# Patient Record
Sex: Female | Born: 1955
Health system: Southern US, Community
[De-identification: ages and names within clinical notes are randomized; demographics above are authoritative.]

## PROBLEM LIST (undated history)

## (undated) DIAGNOSIS — I10 Essential (primary) hypertension: Secondary | ICD-10-CM

## (undated) DIAGNOSIS — I4891 Unspecified atrial fibrillation: Secondary | ICD-10-CM

## (undated) DIAGNOSIS — N200 Calculus of kidney: Secondary | ICD-10-CM

## (undated) DIAGNOSIS — M797 Fibromyalgia: Secondary | ICD-10-CM

## (undated) DIAGNOSIS — F329 Major depressive disorder, single episode, unspecified: Secondary | ICD-10-CM

## (undated) DIAGNOSIS — F32A Depression, unspecified: Secondary | ICD-10-CM

## (undated) DIAGNOSIS — E785 Hyperlipidemia, unspecified: Secondary | ICD-10-CM

## (undated) DIAGNOSIS — K589 Irritable bowel syndrome without diarrhea: Secondary | ICD-10-CM

## (undated) HISTORY — DX: Hyperlipidemia, unspecified: E78.5

## (undated) HISTORY — DX: Unspecified atrial fibrillation: I48.91

## (undated) HISTORY — PX: APPENDECTOMY: SHX54

## (undated) HISTORY — DX: Irritable bowel syndrome, unspecified: K58.9

## (undated) HISTORY — DX: Depression, unspecified: F32.A

## (undated) HISTORY — DX: Major depressive disorder, single episode, unspecified: F32.9

## (undated) HISTORY — PX: OOPHORECTOMY: SHX86

## (undated) HISTORY — PX: ABDOMINAL HYSTERECTOMY: SHX81

## (undated) HISTORY — DX: Calculus of kidney: N20.0

## (undated) HISTORY — DX: Essential (primary) hypertension: I10

## (undated) HISTORY — DX: Fibromyalgia: M79.7

## (undated) HISTORY — PX: TUBAL LIGATION: SHX77

## (undated) HISTORY — PX: CHOLECYSTECTOMY: SHX55

## (undated) HISTORY — PX: TONSILLECTOMY: SUR1361

---

## 2010-10-25 ENCOUNTER — Encounter: Payer: Self-pay | Admitting: Cardiology

## 2010-10-26 ENCOUNTER — Encounter (INDEPENDENT_AMBULATORY_CARE_PROVIDER_SITE_OTHER): Payer: Medicare Other

## 2010-10-26 ENCOUNTER — Ambulatory Visit (INDEPENDENT_AMBULATORY_CARE_PROVIDER_SITE_OTHER): Payer: Medicare Other | Admitting: Cardiology

## 2010-10-26 ENCOUNTER — Encounter: Payer: Self-pay | Admitting: Cardiology

## 2010-10-26 VITALS — BP 120/82 | HR 92 | Ht 67.0 in | Wt 218.0 lb

## 2010-10-26 DIAGNOSIS — R002 Palpitations: Secondary | ICD-10-CM

## 2010-10-26 DIAGNOSIS — E785 Hyperlipidemia, unspecified: Secondary | ICD-10-CM | POA: Insufficient documentation

## 2010-10-26 NOTE — Progress Notes (Signed)
HPI: 55 year old female with no prior cardiac history or evaluation palpitations. Note patient had thyroid functions in September of 2012 were normal. Potassium normal. Patient states she has had intermittent palpitations for approximately 2 years. They are sudden in onset and described as her heart racing. There is short of breath and mild dizziness. There is mild neck tightness. No chest pain. Her symptoms last approximately 4 hours and resolved. She otherwise has mild dyspnea on exertion and pedal edema but no exertional chest pain. No history of syncope. Because of the above we were asked to further evaluate.  Current Outpatient Prescriptions  Medication Sig Dispense Refill  . ALPRAZolam (XANAX) 1 MG tablet Take 1 mg by mouth at bedtime as needed.        [name] aspirin 81 MG tablet Take 81 mg by mouth daily.        . B Complex-Biotin-FA (B COMPLETE PO) Take 1 tablet by mouth daily.        [name] diltiazem (CARDIZEM CD) 180 MG 24 hr capsule Take 180 mg by mouth daily.        [name] eletriptan (RELPAX) 40 MG tablet One tablet by mouth as needed for migraine headache.  If the headache improves and then returns, dose may be repeated after 2 hours have elapsed since first dose (do not exceed 80 mg per day). may repeat in 2 hours if necessary       . GARLIC OIL PO Take 1 tablet by mouth daily.        [name] HYDROcodone-acetaminophen (LORTAB 7.5) 7.5-500 MG per tablet Take 1 tablet by mouth every 6 (six) hours as needed.        [name] KRILL OIL PO Take 1 tablet by mouth daily.        . metoprolol tartrate (LOPRESSOR) 25 MG tablet Take 25 mg by mouth 2 (two) times daily.        [name] NABUMETONE PO Take 750 mg by mouth daily.        . NON FORMULARY SLIMQUICK 2 DAILY       . nortriptyline (PAMELOR) 25 MG capsule 2 tabs po qhs       . solifenacin (VESICARE) 5 MG tablet Take 10 mg by mouth daily.        [name] tiZANidine (ZANAFLEX) 4 MG capsule Take 4 mg by mouth at bedtime.          No Known Allergies  Past Medical History    Diagnosis Date  . Depression   . Migraine   . Hyperlipidemia   . Asthma   . IBS (irritable bowel syndrome)   . Fibromyalgia     Past Surgical History  Procedure Date  . Cholecystectomy   . Oophorectomy   . Tonsillectomy   . Appendectomy   . Cesarean section   . Abdominal hysterectomy     History   Social History  . Marital Status: Married    Spouse Name: N/A    Number of Children: 2  . Years of Education: N/A   Occupational History  .      Disabled   Social History Main Topics  . Smoking status: Never Smoker   . Smokeless tobacco: Not on file  . Alcohol Use: Yes     rarely  . Drug Use: Not on file  . Sexually Active: Not on file   Other Topics Concern  . Not on file   Social History Narrative  . No narrative on file    Family  History  Problem Relation Age of Onset  . Cancer Father   . Heart disease Mother     atrial fibrillation and CHF  . Heart disease Father     atrial fibrillation and CHF    ROS: pain from fibromyalgia but no fevers or chills, productive cough, hemoptysis, dysphasia, odynophagia, melena, hematochezia, dysuria, hematuria, rash, seizure activity, orthopnea, PND,  claudication. Remaining systems are negative.  Physical Exam: General:  Well developed/obese in NAD Skin warm/dry Patient not depressed No peripheral clubbing Back-normal HEENT-normal/normal eyelids Neck supple/normal carotid upstroke bilaterally; no bruits; no JVD; no thyromegaly chest - CTA/ normal expansion CV - RRR/normal S1 and S2; no rubs or gallops;  PMI nondisplaced; 1/6 SEM Abdomen -NT/ND, no HSM, no mass, + bowel sounds, no bruit 2+ femoral pulses, no bruits Ext-trace edema, no chords, 2+ DP Neuro-grossly nonfocal  ECG 09/24/10 NSR, RVCD

## 2010-10-26 NOTE — Assessment & Plan Note (Signed)
Management per primary care. 

## 2010-10-26 NOTE — Patient Instructions (Signed)
Your physician has recommended that you wear an event monitor. Event monitors are medical devices that record the heart's electrical activity. Doctors most often [place] these monitors to diagnose arrhythmias. Arrhythmias are problems with the speed or rhythm of the heartbeat. The monitor is a small, portable device. You can wear one while you do your normal daily activities. This is usually used to diagnose what is causing palpitations/syncope (passing out).   Your physician recommends that you schedule a follow-up appointment in: 6 weeks with Dr [name]  Your physician has requested that you have an echocardiogram. Echocardiography is a painless test that uses sound waves to create images of your heart. It provides your doctor with information about the size and shape of your heart and how well your heart's chambers and valves are working. This procedure takes approximately one hour. There are no restrictions for this procedure.

## 2010-10-26 NOTE — Assessment & Plan Note (Signed)
Plan cardionet. Check echocardiogram for LV function. Recent thyroid functions normal. Continue Lopressor as needed.

## 2010-11-02 ENCOUNTER — Ambulatory Visit (HOSPITAL_COMMUNITY): Payer: Medicare Other | Attending: Cardiology

## 2010-11-02 DIAGNOSIS — I517 Cardiomegaly: Secondary | ICD-10-CM

## 2010-11-02 DIAGNOSIS — E785 Hyperlipidemia, unspecified: Secondary | ICD-10-CM | POA: Insufficient documentation

## 2010-11-02 DIAGNOSIS — I059 Rheumatic mitral valve disease, unspecified: Secondary | ICD-10-CM | POA: Insufficient documentation

## 2010-11-02 DIAGNOSIS — R002 Palpitations: Secondary | ICD-10-CM | POA: Insufficient documentation

## 2010-11-07 ENCOUNTER — Telehealth (HOSPITAL_COMMUNITY): Payer: Self-pay | Admitting: Cardiology

## 2010-11-07 NOTE — Telephone Encounter (Signed)
PT AWARE OF ECHO RESULTS./CY 

## 2010-11-07 NOTE — Telephone Encounter (Signed)
Pt called and stated someone had called her cell, does not know who or why she got a call. She is wearing a monitor.

## 2010-12-05 ENCOUNTER — Telehealth: Payer: Self-pay | Admitting: Cardiology

## 2010-12-05 ENCOUNTER — Ambulatory Visit (INDEPENDENT_AMBULATORY_CARE_PROVIDER_SITE_OTHER): Payer: Medicare Other | Admitting: Cardiology

## 2010-12-05 ENCOUNTER — Encounter: Payer: Self-pay | Admitting: Cardiology

## 2010-12-05 DIAGNOSIS — E785 Hyperlipidemia, unspecified: Secondary | ICD-10-CM

## 2010-12-05 DIAGNOSIS — I4891 Unspecified atrial fibrillation: Secondary | ICD-10-CM

## 2010-12-05 MED ORDER — DILTIAZEM HCL ER COATED BEADS 240 MG PO CP24: 240.0000 mg | ORAL_CAPSULE | Freq: Every day | ORAL | Status: DC

## 2010-12-05 NOTE — Telephone Encounter (Signed)
Pt needs diagnosis from her visit today, pls call tomorrow

## 2010-12-05 NOTE — Assessment & Plan Note (Signed)
The patient appears to have both runs of atrial fibrillation and flutter on her monitor. I will review this with electrophysiology. If it were all atrial flutter we could consider ablation but I do think there is coarse atrial fibrillation as well. Her only embolic risk factor is the female sex. We discussed xeralto or pradaxa but she declined and understands risk of CVA. Continue aspirin. Increase Cardizem to 240 mg daily. If her symptoms persist despite increase in AV node blocking agents we will consider an antiarrhythmic such as flecainide.

## 2010-12-05 NOTE — Assessment & Plan Note (Signed)
Management per primary care. 

## 2010-12-05 NOTE — Progress Notes (Signed)
HPI:55 year old female with no prior cardiac history I initially saw in Oct of 2012 for evaluation of palpitations. Note patient had thyroid functions in September of 2012 were normal. Potassium normal. Cardionet revealed runs of afib/flutter. Echo in Oct of 2012 revealed normal LV function and mild LAE. Since I last saw her she denies dyspnea on exertion, chest pain or syncope. She continued to have intermittent palpitations.   Current Outpatient Prescriptions  Medication Sig Dispense Refill  . ALPRAZolam (XANAX) 1 MG tablet Take 1 mg by mouth at bedtime as needed.        [name] aspirin 81 MG tablet Take 81 mg by mouth daily.        . B Complex-Biotin-FA (B COMPLETE PO) Take 2 tablets by mouth daily.       [name] diltiazem (CARDIZEM CD) 180 MG 24 hr capsule Take 180 mg by mouth daily.        [name] eletriptan (RELPAX) 40 MG tablet One tablet by mouth as needed for migraine headache.  If the headache improves and then returns, dose may be repeated after 2 hours have elapsed since first dose (do not exceed 80 mg per day). may repeat in 2 hours if necessary       . GARLIC OIL PO Take 2 tablets by mouth daily.       [name] HYDROcodone-acetaminophen (LORTAB 7.5) 7.5-500 MG per tablet Take 1 tablet by mouth every 6 (six) hours as needed.        [name] KRILL OIL PO Take 4 tablets by mouth daily.       . metoprolol tartrate (LOPRESSOR) 25 MG tablet Take 25 mg by mouth as needed.       [name] NABUMETONE PO Take 750 mg by mouth daily.        . NON FORMULARY SLIMQUICK 2 DAILY      . nortriptyline (PAMELOR) 25 MG capsule 2 tabs po qhs       . solifenacin (VESICARE) 5 MG tablet Take 10 mg by mouth daily.        [name] tiZANidine (ZANAFLEX) 4 MG capsule Take 4 mg by mouth at bedtime.           Past Medical History  Diagnosis Date  . Depression   . Migraine   . Hyperlipidemia   . Asthma   . IBS (irritable bowel syndrome)   . Fibromyalgia     Past Surgical History  Procedure Date  . Cholecystectomy   . Oophorectomy   .  Tonsillectomy   . Appendectomy   . Cesarean section   . Abdominal hysterectomy     History   Social History  . Marital Status: Married    Spouse Name: N/A    Number of Children: 2  . Years of Education: N/A   Occupational History  .      Disabled   Social History Main Topics  . Smoking status: Never Smoker   . Smokeless tobacco: Not on file  . Alcohol Use: Yes     rarely  . Drug Use: Not on file  . Sexually Active: Not on file   Other Topics Concern  . Not on file   Social History Narrative  . No narrative on file    ROS: no fevers or chills, productive cough, hemoptysis, dysphasia, odynophagia, melena, hematochezia, dysuria, hematuria, rash, seizure activity, orthopnea, PND, pedal edema, claudication. Remaining systems are negative.  Physical Exam: Well-developed well-nourished in no acute distress.  Skin is warm and dry.  HEENT is normal.  Neck is supple. No thyromegaly.  Chest is clear to auscultation with normal expansion.  Cardiovascular exam is regular rate and rhythm.  Abdominal exam nontender or distended. No masses palpated. Extremities show no edema. neuro grossly intact

## 2010-12-05 NOTE — Patient Instructions (Signed)
Your physician recommends that you schedule a follow-up appointment in: 3 MONTHS  INCREASE DILTIAZEM TO 240 MG ONCE DAILY

## 2010-12-06 NOTE — Telephone Encounter (Signed)
PT AWARE  MONITOR SHOWED AFIB AND FLUTTER .Zack Seal

## 2011-08-03 ENCOUNTER — Telehealth: Payer: Self-pay | Admitting: Cardiology

## 2011-08-03 NOTE — Telephone Encounter (Signed)
New msg Pt wants to talk to you about  passing out. She is in [place] and wants to talk to a nurse before making an appt. Please call

## 2011-08-03 NOTE — Telephone Encounter (Signed)
Appt made to see Connie Jackson.  Pt c/o multiple syncopal episodes.

## 2011-08-18 ENCOUNTER — Encounter: Payer: Self-pay | Admitting: Nurse Practitioner

## 2011-08-18 ENCOUNTER — Encounter (HOSPITAL_COMMUNITY): Payer: Self-pay | Admitting: Emergency Medicine

## 2011-08-18 ENCOUNTER — Emergency Department (HOSPITAL_COMMUNITY): Payer: Medicare Other

## 2011-08-18 ENCOUNTER — Observation Stay (HOSPITAL_COMMUNITY)
Admission: EM | Admit: 2011-08-18 | Discharge: 2011-08-20 | Disposition: A | Discharge status: Home / Self Care | Payer: Medicare Other | Attending: Internal Medicine | Admitting: Internal Medicine

## 2011-08-18 ENCOUNTER — Ambulatory Visit (INDEPENDENT_AMBULATORY_CARE_PROVIDER_SITE_OTHER): Payer: Medicare Other | Admitting: Nurse Practitioner

## 2011-08-18 VITALS — BP 112/62 | HR 124 | Ht 67.0 in | Wt 217.0 lb

## 2011-08-18 DIAGNOSIS — R55 Syncope and collapse: Principal | ICD-10-CM | POA: Diagnosis present

## 2011-08-18 DIAGNOSIS — I4891 Unspecified atrial fibrillation: Secondary | ICD-10-CM | POA: Diagnosis present

## 2011-08-18 DIAGNOSIS — E785 Hyperlipidemia, unspecified: Secondary | ICD-10-CM | POA: Diagnosis present

## 2011-08-18 DIAGNOSIS — F319 Bipolar disorder, unspecified: Secondary | ICD-10-CM | POA: Insufficient documentation

## 2011-08-18 DIAGNOSIS — F410 Panic disorder [episodic paroxysmal anxiety] without agoraphobia: Secondary | ICD-10-CM | POA: Insufficient documentation

## 2011-08-18 DIAGNOSIS — F603 Borderline personality disorder: Secondary | ICD-10-CM | POA: Insufficient documentation

## 2011-08-18 DIAGNOSIS — G43909 Migraine, unspecified, not intractable, without status migrainosus: Secondary | ICD-10-CM | POA: Diagnosis present

## 2011-08-18 DIAGNOSIS — R002 Palpitations: Secondary | ICD-10-CM | POA: Diagnosis present

## 2011-08-18 DIAGNOSIS — J45909 Unspecified asthma, uncomplicated: Secondary | ICD-10-CM | POA: Insufficient documentation

## 2011-08-18 LAB — URINALYSIS, ROUTINE W REFLEX MICROSCOPIC
Bilirubin Urine: NEGATIVE
Glucose, UA: NEGATIVE mg/dL
Leukocytes, UA: NEGATIVE
Nitrite: NEGATIVE
Specific Gravity, Urine: 1.008 (ref 1.005–1.030)
pH: 7 (ref 5.0–8.0)

## 2011-08-18 LAB — CBC WITH DIFFERENTIAL/PLATELET
Basophils Relative: 0 % (ref 0–1)
Eosinophils Absolute: 0.1 10*3/uL (ref 0.0–0.7)
Eosinophils Relative: 1 % (ref 0–5)
Lymphs Abs: 1.7 10*3/uL (ref 0.7–4.0)
MCH: 31.4 pg (ref 26.0–34.0)
MCHC: 33.8 g/dL (ref 30.0–36.0)
MCV: 92.9 fL (ref 78.0–100.0)
Neutrophils Relative %: 76 % (ref 43–77)
Platelets: 341 10*3/uL (ref 150–400)
RDW: 13.5 % (ref 11.5–15.5)

## 2011-08-18 LAB — POCT I-STAT TROPONIN I: Troponin i, poc: 0.01 ng/mL (ref 0.00–0.08)

## 2011-08-18 LAB — COMPREHENSIVE METABOLIC PANEL
ALT: 15 U/L (ref 0–35)
Albumin: 4 g/dL (ref 3.5–5.2)
Calcium: 9.6 mg/dL (ref 8.4–10.5)
GFR calc Af Amer: 87 mL/min — ABNORMAL LOW (ref >90)
Glucose, Bld: 107 mg/dL — ABNORMAL HIGH (ref 70–99)
Potassium: 4.2 mEq/L (ref 3.5–5.1)
Sodium: 142 mEq/L (ref 135–145)
Total Protein: 7.6 g/dL (ref 6.0–8.3)

## 2011-08-18 NOTE — ED Provider Notes (Cosign Needed)
History     CSN: [id number]  Arrival date & time 08/18/11  1552   First MD Initiated Contact with Patient 08/18/11 1754      Chief Complaint  Patient presents with  . Atrial Fibrillation    (Consider location/radiation/quality/duration/timing/severity/associated sxs/prior treatment) Patient is a 56 y.o. female presenting with syncope. The history is provided by the patient and the spouse.  Loss of Consciousness This is a new problem. The current episode started 1 to 4 weeks ago. The problem occurs every several days. The problem has been gradually worsening (increasing frequency). Associated symptoms include headaches. Pertinent negatives include no abdominal pain, anorexia, chest pain, chills, congestion, diaphoresis, nausea, neck pain, numbness, urinary symptoms, vertigo, visual change or weakness. Exacerbated by: unknown. She has tried nothing for the symptoms.  Pt has had syncopal episodes of increasing frequency for the past 1.5 weeks.  Pt has had a history of afib and feels her heart racing just prior to episodes.  Husband reports she will have a blank stare and is unresponsive for seconds to minutes prior to losing consciousness.  After the episode she has been unable to speak sometimes for as long as an hour.  Past Medical History  Diagnosis Date  . Depression   . Migraine   . Hyperlipidemia   . Asthma   . IBS (irritable bowel syndrome)   . Fibromyalgia     Past Surgical History  Procedure Date  . Cholecystectomy   . Oophorectomy   . Tonsillectomy   . Appendectomy   . Cesarean section   . Abdominal hysterectomy     Family History  Problem Relation Age of Onset  . Cancer Father   . Heart disease Mother     atrial fibrillation and CHF  . Heart disease Father     atrial fibrillation and CHF    History  Substance Use Topics  . Smoking status: Never Smoker   . Smokeless tobacco: Not on file  . Alcohol Use: Yes     rarely    OB History    Grav Para Term  Preterm Abortions TAB SAB Ect Mult Living                  Review of Systems  Constitutional: Negative for chills and diaphoresis.  HENT: Negative for congestion and neck pain.   Eyes: Negative.   Respiratory: Negative for chest tightness and shortness of breath.   Cardiovascular: Positive for palpitations and syncope. Negative for chest pain.  Gastrointestinal: Negative.  Negative for nausea, abdominal pain and anorexia.  Genitourinary: Negative.   Musculoskeletal: Negative.   Neurological: Positive for dizziness, light-headedness and headaches. Negative for vertigo, weakness and numbness.  All other systems reviewed and are negative.    Allergies  Dht; Penicillins; Promethazine hcl; and Sulfa antibiotics  Home Medications   Current Outpatient Rx  Name Route Sig Dispense Refill  . ALPRAZOLAM 1 MG PO TABS Oral Take 1 mg by mouth 2 (two) times daily as needed. For anxiety    . B COMPLETE PO Oral Take 2 tablets by mouth daily.     . CHLORTHALIDONE 25 MG PO TABS Oral Take 25 mg by mouth daily as needed. For fluid    . CO Q-10 PO Oral Take 2 capsules by mouth daily.    [name] DILTIAZEM HCL ER COATED BEADS 240 MG PO CP24 Oral Take 1 capsule (240 mg total) by mouth daily. 30 capsule 12  . ELETRIPTAN HYDROBROMIDE 40 MG PO TABS Oral One  tablet by mouth as needed for migraine headache.  If the headache improves and then returns, dose may be repeated after 2 hours have elapsed since first dose (do not exceed 80 mg per day). may repeat in 2 hours if necessary     . OMEGA-3 FATTY ACIDS 1000 MG PO CAPS Oral Take 2 g by mouth daily.    [name] GARLIC OIL PO Oral Take 2 tablets by mouth daily.     [name] HYDROCODONE-ACETAMINOPHEN 10-500 MG PO TABS Oral Take 0.5-1 tablets by mouth every 6 (six) hours as needed. For pain    . METOPROLOL TARTRATE 25 MG PO TABS Oral Take 25 mg by mouth at bedtime.     [name] NABUMETONE 750 MG PO TABS Oral Take 750 mg by mouth daily.    [name] NORTRIPTYLINE HCL 25 MG PO CAPS Oral Take 50 mg  by mouth at bedtime.    [name] OVER THE COUNTER MEDICATION Oral Take 2 capsules by mouth daily. lipozene and relacore for weight loss    . OXYCODONE-ACETAMINOPHEN 7.5-325 MG PO TABS Oral Take 0.5-1 tablets by mouth at bedtime as needed. For pain    . SOLIFENACIN SUCCINATE 5 MG PO TABS Oral Take 5 mg by mouth daily.     [name] TIZANIDINE HCL 4 MG PO CAPS Oral Take 4 mg by mouth at bedtime.        BP 130/63  Pulse 87  Temp 97.7 F (36.5 C) (Oral)  Resp 18  SpO2 99%  Physical Exam  Nursing note and vitals reviewed. Constitutional: She is oriented to person, place, and time. She appears well-developed and well-nourished. No distress.  HENT:  Head: Normocephalic and atraumatic.  Eyes: Conjunctivae and EOM are normal. Pupils are equal, round, and reactive to light.  Neck: Neck supple.  Cardiovascular: Normal rate, regular rhythm, normal heart sounds and intact distal pulses.   No murmur heard. Pulmonary/Chest: Effort normal and breath sounds normal. She has no wheezes. She has no rales.  Abdominal: Soft. She exhibits no distension. There is no tenderness.  Musculoskeletal: Normal range of motion.  Neurological: She is alert and oriented to person, place, and time. She has normal strength. No cranial nerve deficit or sensory deficit. Coordination normal. GCS eye subscore is 4. GCS verbal subscore is 5. GCS motor subscore is 6.  Skin: Skin is warm and dry.  Psychiatric: Her mood appears anxious.    ED Course  Procedures (including critical care time)  Labs Reviewed  CBC WITH DIFFERENTIAL - Abnormal; Notable for the following:    Neutro Abs 8.0 (*)     All other components within normal limits  COMPREHENSIVE METABOLIC PANEL - Abnormal; Notable for the following:    Glucose, Bld 107 (*)     GFR calc non Af Amer 75 (*)     GFR calc Af Amer 87 (*)     All other components within normal limits  URINALYSIS, ROUTINE W REFLEX MICROSCOPIC  POCT I-STAT TROPONIN I  POCT I-STAT TROPONIN I   Ct Head  Wo Contrast  08/18/2011  *RADIOLOGY REPORT*  Clinical Data: Headaches  CT HEAD WITHOUT CONTRAST  Technique:  Contiguous axial images were obtained from the base of the skull through the vertex without contrast.  Comparison: 02/20/2011  Findings:   There is prominence of the sulci and ventricles.  The brain has an otherwise normal appearance without evidence for hemorrhage, infarction, hydrocephalus, or mass lesion.  There is no extra axial fluid collection.  The skull and paranasal  sinuses are normal.  IMPRESSION:  1.  No acute intracranial abnormalities.  Original Report Authenticated By: Rosealee Albee, M.D.     1. Syncope       MDM  56 yo female with PMHx of Afib, migraines, depression who presents to the ED for increasingly frequent syncopal episodes.  Pt has had syncopal episodes of increasing frequency for the past 1.5 weeks.  Pt has had a history of afib and feels her heart racing just prior to episodes.  Husband reports she will have a blank stare and is unresponsive for seconds to minutes prior to losing consciousness.  After the episode she has been unable to speak sometimes for as long as an hour.  AF, VSS, NAD at presentation.  Physical exam as documented above with normal neurologic exam including sensation, motor and cerebellar testing.  Presentation concerning for possible cardiac cause or neurologic cause such as seizure or complicated migraine.   EKG NSR, nml intervals, no ST or T wave changes. CBC, CMP, troponin wnl.  UA w/o signs of UTI.  CT head with NAICA.  Will consult Neurology for further evaluation and Hospitalist for admission.    Pt admitted to the care of Hospitalist service in stable condition.      Cherre Robins, MD 08/19/11 [phone]

## 2011-08-18 NOTE — ED Notes (Signed)
Pt st's she has been having syncopal episodes and was at her MD's office today when when she had another syncopal episode.  Pt st's did not take her pm meds till this am at 4:00 for her a-fib and also only had 3 hours of sleep last pm.  Pt denies any pain or discomfort at this time.  Pt's husband at bedside

## 2011-08-18 NOTE — Progress Notes (Signed)
Connie Jackson Date of Birth: 1955-02-21 Medical Record #[id number]  History of Present Illness: Connie Jackson is seen today for a work in visit. She is seen for Dr. [name]. She has had PAF/flutter noted last year with a normal echo in October. She has normal LV function. She is here due to "passing out spells". She notes that her first episode was back in February. She was at a family member's house and "just went out". She couldn't get her words out. EMS was called and she was sent to the ER at [facility]. Had some labs and then sent back home. 2nd episode was on June 23rd. She had just eaten and was in the back seat of the car. Her husband called her name and she did not response. He noted a bland stare and she just drifted off to the side. Again, she could not talk. This lasted for about 30 minutes until she came around but speech did not return for about 2 hours. Last spell (date not given) she just broke out in a cold sweat and then went out. She was alone. When she came to, she was fine. She notes her heart racing. No chest pain.   Current Outpatient Prescriptions on File Prior to Visit  Medication Sig Dispense Refill  . ALPRAZolam (XANAX) 1 MG tablet Take 1 mg by mouth at bedtime as needed.        [name] aspirin 81 MG tablet Take 81 mg by mouth daily.        . B Complex-Biotin-FA (B COMPLETE PO) Take 2 tablets by mouth daily.       . chlorthalidone (HYGROTON) 25 MG tablet Take 1 tablet by mouth daily.      [name] diltiazem (CARDIZEM CD) 240 MG 24 hr capsule Take 1 capsule (240 mg total) by mouth daily.  30 capsule  12  . eletriptan (RELPAX) 40 MG tablet One tablet by mouth as needed for migraine headache.  If the headache improves and then returns, dose may be repeated after 2 hours have elapsed since first dose (do not exceed 80 mg per day). may repeat in 2 hours if necessary       . GARLIC OIL PO Take 2 tablets by mouth daily.       [name] HYDROcodone-acetaminophen (LORTAB 7.5) 7.5-500 MG per tablet Take  1 tablet by mouth every 6 (six) hours as needed.        [name] KRILL OIL PO Take 4 tablets by mouth daily.       . metoprolol tartrate (LOPRESSOR) 25 MG tablet Take 25 mg by mouth as needed.       [name] NABUMETONE PO Take 750 mg by mouth daily.        . nortriptyline (PAMELOR) 25 MG capsule 2 tabs po qhs       . solifenacin (VESICARE) 5 MG tablet Take 10 mg by mouth daily.        [name] tiZANidine (ZANAFLEX) 4 MG capsule Take 4 mg by mouth at bedtime.          Allergies  Allergen Reactions  . Penicillins     Almost died  . Promethazine Hcl   . Sulfa Antibiotics     Past Medical History  Diagnosis Date  . Depression   . Migraine   . Hyperlipidemia   . Asthma   . IBS (irritable bowel syndrome)   . Fibromyalgia     Past Surgical History  Procedure Date  . Cholecystectomy   .  Oophorectomy   . Tonsillectomy   . Appendectomy   . Cesarean section   . Abdominal hysterectomy     History  Smoking status  . Never Smoker   Smokeless tobacco  . Not on file    History  Alcohol Use  . Yes    rarely    Family History  Problem Relation Age of Onset  . Cancer Father   . Heart disease Mother     atrial fibrillation and CHF  . Heart disease Father     atrial fibrillation and CHF    Review of Systems: The review of systems is per the HPI.  All other systems were reviewed and are negative.  Physical Exam: BP 112/62  Pulse 124  Ht 5\' 7"  (1.702 m)  Wt 217 lb (98.431 kg)  BMI 33.99 kg/m2  SpO2 99%  LABORATORY DATA: EKG today shows atrial fib with RVR. Rate is 124.   Assessment / Plan:

## 2011-08-18 NOTE — Assessment & Plan Note (Addendum)
While I was talking with the patient and her husband, she closed her eyes and became unresponsive. She drifted over to her side again. She would not arouse. She did have a pulse and it was fast. CODE BLUE was called. She was placed on the monitor at 3:12pm and it showed her to still be in atrial fib with a rate of 150. BP was up to 170/100. BP was 140/100 at 3:15 and she was slowly coming around with sternal rub. Oxygen sat was 99%. She was placed on a NRB mask. IV access was obtained and she was given NS at a wide open rate thru an IV in the right forearm. She was not able to speak. Had very weak grips. She could communicate by blinking her eyes. Some fluttering of her eyes were noted. She seemed to have some control over her right arm when it was dropped over her face. At 3:20 her blood pressure was 130/80. HR was 152. RR 24. She converted to sinus rhythm at 3:21 pm. EMS had already been called. Dr. Tenny Craw and other staff were present in the room. Dr. Tenny Craw called the ER and spoke with the ER physician. She was transported by stretcher at 3:25 pm. Belongings were given to her husband. Patient is transported to the ER for further care and evaluation. Husband was updated.

## 2011-08-18 NOTE — ED Notes (Signed)
Pt arrived via EMS with c/o a-fib with a hx of a-fib. Pt went to PCP where she had some syncope with her a-fib. Pt noted to be responsive, but non-verbal. Pt might be having some stress as well. EMS noted HR 104 BP 134/70.

## 2011-08-18 NOTE — ED Notes (Signed)
Pt requesting something to drink explained to pt that Dr. Ranae Palms does not want her to drink anything at this time due to possible futher testing.  Pt voices understanding.  Pt resting with no complaints at this time.  Alert and oriented x's 3, husband remains at bedside.[name]

## 2011-08-19 ENCOUNTER — Encounter (HOSPITAL_COMMUNITY): Payer: Self-pay | Admitting: Internal Medicine

## 2011-08-19 ENCOUNTER — Observation Stay (HOSPITAL_COMMUNITY): Payer: Medicare Other

## 2011-08-19 DIAGNOSIS — G43909 Migraine, unspecified, not intractable, without status migrainosus: Secondary | ICD-10-CM | POA: Diagnosis present

## 2011-08-19 DIAGNOSIS — I4891 Unspecified atrial fibrillation: Secondary | ICD-10-CM

## 2011-08-19 DIAGNOSIS — F410 Panic disorder [episodic paroxysmal anxiety] without agoraphobia: Secondary | ICD-10-CM

## 2011-08-19 DIAGNOSIS — F603 Borderline personality disorder: Secondary | ICD-10-CM

## 2011-08-19 DIAGNOSIS — F319 Bipolar disorder, unspecified: Secondary | ICD-10-CM

## 2011-08-19 LAB — CARDIAC PANEL(CRET KIN+CKTOT+MB+TROPI)
CK, MB: 3.2 ng/mL (ref 0.3–4.0)
CK, MB: 2.5 ng/mL (ref 0.3–4.0)
Relative Index: 1.8 (ref 0.0–2.5)
Relative Index: 1.9 (ref 0.0–2.5)
Total CK: 181 U/L — ABNORMAL HIGH (ref 7–177)
Total CK: 152 U/L (ref 7–177)
Total CK: 130 U/L (ref 7–177)
Troponin I: <0.3 ng/mL (ref <0.30)

## 2011-08-19 LAB — COMPREHENSIVE METABOLIC PANEL
AST: 19 U/L (ref 0–37)
CO2: 23 mEq/L (ref 19–32)
Calcium: 9.2 mg/dL (ref 8.4–10.5)
Creatinine, Ser: 0.82 mg/dL (ref 0.50–1.10)
GFR calc non Af Amer: 79 mL/min — ABNORMAL LOW (ref >90)
Sodium: 142 mEq/L (ref 135–145)
Total Protein: 6.5 g/dL (ref 6.0–8.3)

## 2011-08-19 LAB — CREATININE, SERUM
Creatinine, Ser: 0.79 mg/dL (ref 0.50–1.10)
GFR calc Af Amer: >90 mL/min (ref >90)
GFR calc non Af Amer: >90 mL/min (ref >90)

## 2011-08-19 LAB — TSH: TSH: 2.085 u[IU]/mL (ref 0.350–4.500)

## 2011-08-19 LAB — CBC
HCT: 45.6 % (ref 36.0–46.0)
HCT: 36.4 % (ref 36.0–46.0)
Hemoglobin: 15 g/dL (ref 12.0–15.0)
Hemoglobin: 11.8 g/dL — ABNORMAL LOW (ref 12.0–15.0)
MCH: 30.2 pg (ref 26.0–34.0)
RBC: 4.91 MIL/uL (ref 3.87–5.11)
RBC: 3.91 MIL/uL (ref 3.87–5.11)
WBC: 7.5 10*3/uL (ref 4.0–10.5)

## 2011-08-19 MED ORDER — ONDANSETRON HCL 4 MG PO TABS: 4.0000 mg | ORAL_TABLET | Freq: Four times a day (QID) | ORAL | Status: DC | PRN

## 2011-08-19 MED ORDER — ACETAMINOPHEN 160 MG/5ML PO SOLN
162.5000 mg | Freq: Every evening | ORAL | Status: DC | PRN
  Filled 2011-08-19: qty 10.2

## 2011-08-19 MED ORDER — ENOXAPARIN SODIUM 40 MG/0.4ML ~~LOC~~ SOLN
40.0000 mg | SUBCUTANEOUS | Status: DC
  Filled 2011-08-19 (×2): qty 0.4

## 2011-08-19 MED ORDER — NORTRIPTYLINE HCL 25 MG PO CAPS
50.0000 mg | ORAL_CAPSULE | Freq: Every day | ORAL | Status: DC
  Administered 2011-08-19 (×2): 50 mg via ORAL
  Filled 2011-08-19 (×4): qty 2

## 2011-08-19 MED ORDER — GADOBENATE DIMEGLUMINE 529 MG/ML IV SOLN
20.0000 mL | Freq: Once | INTRAVENOUS | Status: AC | PRN
  Administered 2011-08-19: 20 mL via INTRAVENOUS

## 2011-08-19 MED ORDER — SODIUM CHLORIDE 0.9 % IJ SOLN
3.0000 mL | Freq: Two times a day (BID) | INTRAMUSCULAR | Status: DC
  Administered 2011-08-19 – 2011-08-20 (×3): 3 mL via INTRAVENOUS

## 2011-08-19 MED ORDER — ELETRIPTAN HYDROBROMIDE 40 MG PO TABS: 40.0000 mg | ORAL_TABLET | Freq: Two times a day (BID) | ORAL | Status: DC | PRN

## 2011-08-19 MED ORDER — DILTIAZEM HCL ER COATED BEADS 300 MG PO CP24
300.0000 mg | ORAL_CAPSULE | Freq: Every day | ORAL | Status: DC
  Administered 2011-08-20: 300 mg via ORAL
  Filled 2011-08-19: qty 1

## 2011-08-19 MED ORDER — DILTIAZEM HCL ER COATED BEADS 180 MG PO CP24: 180.0000 mg | ORAL_CAPSULE | Freq: Every day | ORAL | Status: DC

## 2011-08-19 MED ORDER — ONDANSETRON HCL 4 MG/2ML IJ SOLN: 4.0000 mg | Freq: Four times a day (QID) | INTRAMUSCULAR | Status: DC | PRN

## 2011-08-19 MED ORDER — SODIUM CHLORIDE 0.9 % IV SOLN
INTRAVENOUS | Status: DC
  Administered 2011-08-19 – 2011-08-20 (×2): via INTRAVENOUS

## 2011-08-19 MED ORDER — DILTIAZEM HCL ER COATED BEADS 240 MG PO CP24
240.0000 mg | ORAL_CAPSULE | Freq: Every day | ORAL | Status: DC
  Administered 2011-08-19: 240 mg via ORAL
  Filled 2011-08-19: qty 1

## 2011-08-19 MED ORDER — ACETAMINOPHEN 650 MG RE SUPP: 650.0000 mg | Freq: Four times a day (QID) | RECTAL | Status: DC | PRN

## 2011-08-19 MED ORDER — METOPROLOL TARTRATE 25 MG PO TABS
25.0000 mg | ORAL_TABLET | Freq: Every day | ORAL | Status: DC
  Administered 2011-08-19: 25 mg via ORAL
  Filled 2011-08-19 (×3): qty 1

## 2011-08-19 MED ORDER — NABUMETONE 750 MG PO TABS
750.0000 mg | ORAL_TABLET | Freq: Every day | ORAL | Status: DC
  Administered 2011-08-19 – 2011-08-20 (×2): 750 mg via ORAL
  Filled 2011-08-19 (×2): qty 1

## 2011-08-19 MED ORDER — TIZANIDINE HCL 4 MG PO TABS
4.0000 mg | ORAL_TABLET | Freq: Every day | ORAL | Status: DC
  Administered 2011-08-19 (×2): 4 mg via ORAL
  Filled 2011-08-19 (×3): qty 1

## 2011-08-19 MED ORDER — OXYCODONE HCL 5 MG/5ML PO SOLN: 3.7500 mg | Freq: Every evening | ORAL | Status: DC | PRN

## 2011-08-19 MED ORDER — ALPRAZOLAM 0.5 MG PO TABS
1.0000 mg | ORAL_TABLET | Freq: Two times a day (BID) | ORAL | Status: DC | PRN
  Filled 2011-08-19: qty 1

## 2011-08-19 MED ORDER — ACETAMINOPHEN 325 MG PO TABS: 650.0000 mg | ORAL_TABLET | Freq: Four times a day (QID) | ORAL | Status: DC | PRN

## 2011-08-19 MED ORDER — OXYCODONE-ACETAMINOPHEN 7.5-325 MG PO TABS: 0.5000 | ORAL_TABLET | Freq: Every evening | ORAL | Status: DC | PRN

## 2011-08-19 MED ORDER — CHLORTHALIDONE 25 MG PO TABS
25.0000 mg | ORAL_TABLET | Freq: Every day | ORAL | Status: DC
  Administered 2011-08-19: 25 mg via ORAL
  Filled 2011-08-19 (×2): qty 1

## 2011-08-19 NOTE — Progress Notes (Addendum)
[facility] PROGRESS NOTE  Connie Jackson JYN:[id number] DOB: Jun 08, 1955 DOA: 08/18/2011 PCP: Paulina Fusi, MD  Assessment/Plan: Active Problems:  Palpitations  Hyperlipidemia  Atrial fibrillation  Syncope  Migraine headache #1 syncope: Patient will be admitted to a monitored bed. She had a similar episode at the cardiologist's office yesterday. At that time she was found to be neutral fibrillation with a rate in the 150s. Further workup will include  echocardiogram, MRI of the brain, carotid Dopplers, she'll probably benefit from EEG but we will defer that to neurology.   #2 atrial fibrillation: She is in normal sinus is a now. She reportedly had a fever earlier in the day so this could be paroxysmal. Continue Cardizem. Cardiology consult called stop Patient refused anticoagulation for her afib and according to her.   #3 recurrent migraine headaches: She is not having headaches now but number of attacks seem to indicated intractable symptoms. Neurology has already been consulted. and will check MRI/MRA of the brain to rule out any intracerebral cause of her headaches.    #4 anxiety disorders: Patient seemed to have an element of panic disorder. Psychiatry has been consulted. She is on alprazolam which I will continue with in the hospital. Evaluate for pseudoseizures, malingering, psychogenic syncope  #5 hyperlipidemia: Check fasting lipid panel and continue with her statin.       HPI/Subjective: Telemetry consistent with sinus bradycardia, sinus rhythm  Objective: Filed Vitals:   08/18/11 1814 08/18/11 2130 08/19/11 0146 08/19/11 0400  BP: 106/82 130/63 134/85 94/62  Pulse: 92 87 75 63  Temp:   98 F (36.7 C) 97.9 F (36.6 C)  TempSrc: Oral  Oral Oral  Resp: 19 18 18 18   Height:   5\' 7"  (1.702 m)   Weight:   96.616 kg (213 lb)   SpO2: 98% 99% 96% 96%   No intake or output data in the 24 hours ending 08/19/11 0921  Exam:  HENT:  Head: Atraumatic.  Nose:  Nose normal.  Mouth/Throat: Oropharynx is clear and moist.  Eyes: Conjunctivae are normal. Pupils are equal, round, and reactive to light. No scleral icterus.  Neck: Neck supple. No tracheal deviation present.  Cardiovascular: Normal rate, regular rhythm, normal heart sounds and intact distal pulses.  Pulmonary/Chest: Effort normal and breath sounds normal. No respiratory distress.  Abdominal: Soft. Normal appearance and bowel sounds are normal. She exhibits no distension. There is no tenderness.  Musculoskeletal: She exhibits no edema and no tenderness.  Neurological: She is alert. No cranial nerve deficit.    Data Reviewed: Basic Metabolic Panel:  Lab 08/19/11 8657 08/18/11 1836  NA -- 142  K -- 4.2  CL -- 106  CO2 -- 28  GLUCOSE -- 107*  BUN -- 19  CREATININE 0.79 0.86  CALCIUM -- 9.6  MG -- --  PHOS -- --    Liver Function Tests:  Lab 08/18/11 1836  AST 20  ALT 15  ALKPHOS 104  BILITOT 0.3  PROT 7.6  ALBUMIN 4.0   No results found for this basename: LIPASE:5,AMYLASE:5 in the last 168 hours No results found for this basename: AMMONIA:5 in the last 168 hours  CBC:  Lab 08/19/11 0640 08/19/11 0153 08/18/11 1836  WBC 8.2 7.5 10.5  NEUTROABS -- -- 8.0*  HGB 11.8* 15.0 13.2  HCT 36.4 45.6 39.1  MCV 93.1 92.9 92.9  PLT 305 278 341    Cardiac Enzymes:  Lab 08/19/11 0153  CKTOTAL 181*  CKMB 3.2  CKMBINDEX --  TROPONINI <  0.30   BNP (last 3 results) No results found for this basename: PROBNP:3 in the last 8760 hours   CBG: No results found for this basename: GLUCAP:5 in the last 168 hours  No results found for this or any previous visit (from the past 240 hour(s)).   Studies: Ct Head Wo Contrast  08/18/2011  *RADIOLOGY REPORT*  Clinical Data: Headaches  CT HEAD WITHOUT CONTRAST  Technique:  Contiguous axial images were obtained from the base of the skull through the vertex without contrast.  Comparison: 02/20/2011  Findings:   There is prominence of the  sulci and ventricles.  The brain has an otherwise normal appearance without evidence for hemorrhage, infarction, hydrocephalus, or mass lesion.  There is no extra axial fluid collection.  The skull and paranasal sinuses are normal.  IMPRESSION:  1.  No acute intracranial abnormalities.  Original Report Authenticated By: Rosealee Albee, M.D.    Scheduled Meds:   . chlorthalidone  25 mg Oral Daily  . diltiazem  240 mg Oral Daily  . enoxaparin (LOVENOX) injection  40 mg Subcutaneous Q24H  . metoprolol tartrate  25 mg Oral QHS  . nabumetone  750 mg Oral Daily  . nortriptyline  50 mg Oral QHS  . sodium chloride  3 mL Intravenous Q12H  . tiZANidine  4 mg Oral QHS   Continuous Infusions:   . sodium chloride 100 mL/hr at 08/19/11 0335    Active Problems:  Palpitations  Hyperlipidemia  Atrial fibrillation  Syncope  Migraine headache    Time spent: 40 minutes   [facility]  [facility] Pager [phone]. If 8PM-8AM, please contact night-coverage at [email or web address], password [facility] 08/19/2011, 9:21 AM  LOS: 1 day

## 2011-08-19 NOTE — H&P (Signed)
Connie Jackson is an 56 y.o. female.   Chief Complaint: Syncope HPI: A 57 year old female with multiple medical problems who had episodes of extra fibrillation earlier in the day and was seen by her cardiologist presenting to the hospital with episodes of passing out. These have actually been going on for about a week and she's passed out at least 4-5 times. She has long-standing history of migraine headaches since the 1980s. Back in the 80s, she had a scan that showed some brain lesion according to her. She was seen a neurologist at that time but has not seen neurologist since then. She has taken several different medications for the migraine but not helping. Jaundice episodes the patient was having she has been staring ahead according to the husband followed by complete loss of consciousness lasted a few minutes and then she will regain it thereafter. Most of the time the headache will follow the episodes. She has also been claustrophobic worried having shortness of breath and is sweating and at the time of these episodes. No tonic-clonic seizures. No post ictal symptoms.  Past Medical History  Diagnosis Date  . Depression   . Migraine   . Hyperlipidemia   . Asthma   . IBS (irritable bowel syndrome)   . Fibromyalgia     Past Surgical History  Procedure Date  . Cholecystectomy   . Oophorectomy   . Tonsillectomy   . Appendectomy   . Cesarean section   . Abdominal hysterectomy     Family History  Problem Relation Age of Onset  . Cancer Father   . Heart disease Mother     atrial fibrillation and CHF  . Heart disease Father     atrial fibrillation and CHF   Social History:  reports that she has never smoked. She does not have any smokeless tobacco history on file. She reports that she drinks alcohol. Her drug history not on file.  Allergies:  Allergies  Allergen Reactions  . Dht (Dihydrotachysterol) Dermatitis    Scar at injection site  . Penicillins     Almost died  .  Promethazine Hcl   . Sulfa Antibiotics      (Not in a hospital admission)  Results for orders placed during the hospital encounter of 08/18/11 (from the past 48 hour(s))  CBC WITH DIFFERENTIAL     Status: Abnormal   Collection Time   08/18/11  6:36 PM      Component Value Range Comment   WBC 10.5  4.0 - 10.5 K/uL    RBC 4.21  3.87 - 5.11 MIL/uL    Hemoglobin 13.2  12.0 - 15.0 g/dL    HCT 81.1  91.4 - 78.2 %    MCV 92.9  78.0 - 100.0 fL    MCH 31.4  26.0 - 34.0 pg    MCHC 33.8  30.0 - 36.0 g/dL    RDW 95.6  21.3 - 08.6 %    Platelets 341  150 - 400 K/uL    Neutrophils Relative 76  43 - 77 %    Neutro Abs 8.0 (*) 1.7 - 7.7 K/uL    Lymphocytes Relative 16  12 - 46 %    Lymphs Abs 1.7  0.7 - 4.0 K/uL    Monocytes Relative 6  3 - 12 %    Monocytes Absolute 0.6  0.1 - 1.0 K/uL    Eosinophils Relative 1  0 - 5 %    Eosinophils Absolute 0.1  0.0 - 0.7 K/uL  Basophils Relative 0  0 - 1 %    Basophils Absolute 0.0  0.0 - 0.1 K/uL   COMPREHENSIVE METABOLIC PANEL     Status: Abnormal   Collection Time   08/18/11  6:36 PM      Component Value Range Comment   Sodium 142  135 - 145 mEq/L    Potassium 4.2  3.5 - 5.1 mEq/L    Chloride 106  96 - 112 mEq/L    CO2 28  19 - 32 mEq/L    Glucose, Bld 107 (*) 70 - 99 mg/dL    BUN 19  6 - 23 mg/dL    Creatinine, Ser 1.61  0.50 - 1.10 mg/dL    Calcium 9.6  8.4 - 09.6 mg/dL    Total Protein 7.6  6.0 - 8.3 g/dL    Albumin 4.0  3.5 - 5.2 g/dL    AST 20  0 - 37 U/L    ALT 15  0 - 35 U/L    Alkaline Phosphatase 104  39 - 117 U/L    Total Bilirubin 0.3  0.3 - 1.2 mg/dL    GFR calc non Af Amer 75 (*) >90 mL/min    GFR calc Af Amer 87 (*) >90 mL/min   POCT I-STAT TROPONIN I     Status: Normal   Collection Time   08/18/11  6:53 PM      Component Value Range Comment   Troponin i, poc 0.00  0.00 - 0.08 ng/mL    Comment 3            POCT I-STAT TROPONIN I     Status: Normal   Collection Time   08/18/11  7:11 PM      Component Value Range Comment    Troponin i, poc 0.01  0.00 - 0.08 ng/mL    Comment 3            URINALYSIS, ROUTINE W REFLEX MICROSCOPIC     Status: Normal   Collection Time   08/18/11  7:25 PM      Component Value Range Comment   Color, Urine YELLOW  YELLOW    APPearance CLEAR  CLEAR    Specific Gravity, Urine 1.008  1.005 - 1.030    pH 7.0  5.0 - 8.0    Glucose, UA NEGATIVE  NEGATIVE mg/dL    Hgb urine dipstick NEGATIVE  NEGATIVE    Bilirubin Urine NEGATIVE  NEGATIVE    Ketones, ur NEGATIVE  NEGATIVE mg/dL    Protein, ur NEGATIVE  NEGATIVE mg/dL    Urobilinogen, UA 0.2  0.0 - 1.0 mg/dL    Nitrite NEGATIVE  NEGATIVE    Leukocytes, UA NEGATIVE  NEGATIVE MICROSCOPIC NOT DONE ON URINES WITH NEGATIVE PROTEIN, BLOOD, LEUKOCYTES, NITRITE, OR GLUCOSE <1000 mg/dL.   Ct Head Wo Contrast  08/18/2011  *RADIOLOGY REPORT*  Clinical Data: Headaches  CT HEAD WITHOUT CONTRAST  Technique:  Contiguous axial images were obtained from the base of the skull through the vertex without contrast.  Comparison: 02/20/2011  Findings:   There is prominence of the sulci and ventricles.  The brain has an otherwise normal appearance without evidence for hemorrhage, infarction, hydrocephalus, or mass lesion.  There is no extra axial fluid collection.  The skull and paranasal sinuses are normal.  IMPRESSION:  1.  No acute intracranial abnormalities.  Original Report Authenticated By: Rosealee Albee, M.D.    Review of Systems  Constitutional: Positive for malaise/fatigue.  Eyes: Positive for  photophobia and pain.  Respiratory: Positive for shortness of breath.   Cardiovascular: Positive for palpitations.  Gastrointestinal: Negative.   Genitourinary: Negative.   Musculoskeletal: Negative.   Skin: Negative.   Neurological: Positive for dizziness, loss of consciousness, weakness and headaches.  Endo/Heme/Allergies: Negative.   Psychiatric/Behavioral: Positive for depression. The patient is nervous/anxious.     Blood pressure 130/63, pulse 87,  temperature 97.7 F (36.5 C), temperature source Oral, resp. rate 18, SpO2 99.00%. Physical Exam  Constitutional: She is oriented to person, place, and time. She appears well-developed and well-nourished.  HENT:  Head: Normocephalic and atraumatic.  Right Ear: External ear normal.  Left Ear: External ear normal.  Nose: Nose normal.  Mouth/Throat: Oropharynx is clear and moist.  Eyes: Conjunctivae and EOM are normal. Pupils are equal, round, and reactive to light.  Neck: Normal range of motion. Neck supple.  Cardiovascular: Normal rate, regular rhythm, normal heart sounds and intact distal pulses.   Respiratory: Effort normal and breath sounds normal.  GI: Soft. Bowel sounds are normal.  Musculoskeletal: Normal range of motion.  Neurological: She is alert and oriented to person, place, and time. She has normal reflexes.  Skin: Skin is warm and dry.  Psychiatric: She has a normal mood and affect. Her behavior is normal. Judgment and thought content normal.     Assessment/Plan A 56 year old female who is apparent atrial fibrillation episodes but also presenting with reported multiple syncopal episodes. Patient seemed to be having more like panic attacks when she had that episode in my presence. She did however also have small CVA, more than likely a TIA, transient arrhythmias, and this could be migraine complex presentation. She has risk factors for this CVA and coronary arteries syndrome. She is also on multiple medications and has severe anxiety disorder. Seizure is more than likely it in the differential.  Plan #1 syncope: Patient will be admitted to a monitored bed. Her EKG showed normal sinus rhythm at this point but we will check an echocardiogram, MRI of the brain, carotid Dopplers, she'll probably benefit from EEG but we will defer that to neurology.  #2 atrial fibrillation: She is in normal sinus is a now. She reportedly had a fever earlier in the day so this could be paroxysmal. I  will monitor her on telemetry bed and continue her diltiazem. Patient refused anticoagulation for her afib and according to her.  #3 recurrent migraine headaches: She is not having headaches now but number of attacks seem to indicated intractable symptoms. We will get neurology involved and will check MRI/MRA of the brain to rule out any intracerebral cause of her headaches.  #4 anxiety disorders: Patient seemed to have an element of panic disorder. She is on alprazolam which I will continue with in the hospital. If however all workup is negative this would reflect some type of panic attacks.  #5 hyperlipidemia: Check fasting lipid panel and continue with her statin.  GARBA,LAWAL 08/19/2011, 12:07 AM

## 2011-08-19 NOTE — ED Notes (Signed)
Pt ambulatory to bathroom without any problems 

## 2011-08-19 NOTE — Consult Note (Signed)
Reason for Consult:paroxysmal atrial fib Referring Physician: Dr. Lu Duffel Primary Cardiologist: Dr. Jeronimo Connie Connie Jackson is an 56 y.o. female.  HPI: This patient has had a history of paroxysmal atrial fib. Saw Dr. [name] October 2012 and had normal echo at that time.  Seen yesterday for a work-in visit by Connie Fredrickson NP for evaluation of recurrent "passing out spells".  Had a spell at the office with loss of consciousness but no loss of pulse.  Her pulse was fast and irregular in atrial fib at a rate of 150. BP 170/100.  Given oxygen and IV fluid bolus in office and transferred here. She converted back to NSR prior to transfer to [facility].  Patient states this spell was similar to previous spells.  She has a feeling and a warning before she passes out. She is able to get to a soft spot so that she does not injure herself with a fall. Patient has past history of hypertension controlled with medication.  No history of diabetes or CHF or stroke.  CHADSS score is 1 (BP).  Past Medical History  Diagnosis Date  . Depression   . Migraine   . Hyperlipidemia   . Asthma   . IBS (irritable bowel syndrome)   . Fibromyalgia     Past Surgical History  Procedure Date  . Cholecystectomy   . Oophorectomy   . Tonsillectomy   . Appendectomy   . Cesarean section   . Abdominal hysterectomy     Family History  Problem Relation Age of Onset  . Cancer Father   . Heart disease Mother     atrial fibrillation and CHF  . Heart disease Father     atrial fibrillation and CHF    Social History:  reports that she has never smoked. She does not have any smokeless tobacco history on file. She reports that she drinks alcohol. Her drug history not on file.  Allergies:  Allergies  Allergen Reactions  . Dht (Dihydrotachysterol) Dermatitis    Scar at injection site  . Penicillins     Almost died  . Promethazine Hcl   . Sulfa Antibiotics     Medications:  Scheduled:   . chlorthalidone  25 mg  Oral Daily  . diltiazem  240 mg Oral Daily  . enoxaparin (LOVENOX) injection  40 mg Subcutaneous Q24H  . metoprolol tartrate  25 mg Oral QHS  . nabumetone  750 mg Oral Daily  . nortriptyline  50 mg Oral QHS  . sodium chloride  3 mL Intravenous Q12H  . tiZANidine  4 mg Oral QHS    Results for orders placed during the hospital encounter of 08/18/11 (from the past 48 hour(s))  CBC WITH DIFFERENTIAL     Status: Abnormal   Collection Time   08/18/11  6:36 PM      Component Value Range Comment   WBC 10.5  4.0 - 10.5 K/uL    RBC 4.21  3.87 - 5.11 MIL/uL    Hemoglobin 13.2  12.0 - 15.0 g/dL    HCT 41.3  24.4 - 01.0 %    MCV 92.9  78.0 - 100.0 fL    MCH 31.4  26.0 - 34.0 pg    MCHC 33.8  30.0 - 36.0 g/dL    RDW 27.2  53.6 - 64.4 %    Platelets 341  150 - 400 K/uL    Neutrophils Relative 76  43 - 77 %    Neutro Abs 8.0 (*) 1.7 - 7.7  K/uL    Lymphocytes Relative 16  12 - 46 %    Lymphs Abs 1.7  0.7 - 4.0 K/uL    Monocytes Relative 6  3 - 12 %    Monocytes Absolute 0.6  0.1 - 1.0 K/uL    Eosinophils Relative 1  0 - 5 %    Eosinophils Absolute 0.1  0.0 - 0.7 K/uL    Basophils Relative 0  0 - 1 %    Basophils Absolute 0.0  0.0 - 0.1 K/uL   COMPREHENSIVE METABOLIC PANEL     Status: Abnormal   Collection Time   08/18/11  6:36 PM      Component Value Range Comment   Sodium 142  135 - 145 mEq/L    Potassium 4.2  3.5 - 5.1 mEq/L    Chloride 106  96 - 112 mEq/L    CO2 28  19 - 32 mEq/L    Glucose, Bld 107 (*) 70 - 99 mg/dL    BUN 19  6 - 23 mg/dL    Creatinine, Ser 8.41  0.50 - 1.10 mg/dL    Calcium 9.6  8.4 - 32.4 mg/dL    Total Protein 7.6  6.0 - 8.3 g/dL    Albumin 4.0  3.5 - 5.2 g/dL    AST 20  0 - 37 U/L    ALT 15  0 - 35 U/L    Alkaline Phosphatase 104  39 - 117 U/L    Total Bilirubin 0.3  0.3 - 1.2 mg/dL    GFR calc non Af Amer 75 (*) >90 mL/min    GFR calc Af Amer 87 (*) >90 mL/min   POCT I-STAT TROPONIN I     Status: Normal   Collection Time   08/18/11  6:53 PM      Component  Value Range Comment   Troponin i, poc 0.00  0.00 - 0.08 ng/mL    Comment 3            POCT I-STAT TROPONIN I     Status: Normal   Collection Time   08/18/11  7:11 PM      Component Value Range Comment   Troponin i, poc 0.01  0.00 - 0.08 ng/mL    Comment 3            URINALYSIS, ROUTINE W REFLEX MICROSCOPIC     Status: Normal   Collection Time   08/18/11  7:25 PM      Component Value Range Comment   Color, Urine YELLOW  YELLOW    APPearance CLEAR  CLEAR    Specific Gravity, Urine 1.008  1.005 - 1.030    pH 7.0  5.0 - 8.0    Glucose, UA NEGATIVE  NEGATIVE mg/dL    Hgb urine dipstick NEGATIVE  NEGATIVE    Bilirubin Urine NEGATIVE  NEGATIVE    Ketones, ur NEGATIVE  NEGATIVE mg/dL    Protein, ur NEGATIVE  NEGATIVE mg/dL    Urobilinogen, UA 0.2  0.0 - 1.0 mg/dL    Nitrite NEGATIVE  NEGATIVE    Leukocytes, UA NEGATIVE  NEGATIVE MICROSCOPIC NOT DONE ON URINES WITH NEGATIVE PROTEIN, BLOOD, LEUKOCYTES, NITRITE, OR GLUCOSE <1000 mg/dL.  CBC     Status: Normal   Collection Time   08/19/11  1:53 AM      Component Value Range Comment   WBC 7.5  4.0 - 10.5 K/uL    RBC 4.91  3.87 - 5.11 MIL/uL    Hemoglobin  15.0  12.0 - 15.0 g/dL    HCT 04.5  40.9 - 81.1 %    MCV 92.9  78.0 - 100.0 fL    MCH 30.5  26.0 - 34.0 pg    MCHC 32.9  30.0 - 36.0 g/dL    RDW 91.4  78.2 - 95.6 %    Platelets 278  150 - 400 K/uL   CREATININE, SERUM     Status: Normal   Collection Time   08/19/11  1:53 AM      Component Value Range Comment   Creatinine, Ser 0.79  0.50 - 1.10 mg/dL    GFR calc non Af Amer >90  >90 mL/min    GFR calc Af Amer >90  >90 mL/min   TSH     Status: Normal   Collection Time   08/19/11  1:53 AM      Component Value Range Comment   TSH 2.085  0.350 - 4.500 uIU/mL   CARDIAC PANEL(CRET KIN+CKTOT+MB+TROPI)     Status: Abnormal   Collection Time   08/19/11  1:53 AM      Component Value Range Comment   Total CK 181 (*) 7 - 177 U/L    CK, MB 3.2  0.3 - 4.0 ng/mL    Troponin I <0.30  <0.30 ng/mL     Relative Index 1.8  0.0 - 2.5   COMPREHENSIVE METABOLIC PANEL     Status: Abnormal   Collection Time   08/19/11  6:40 AM      Component Value Range Comment   Sodium 142  135 - 145 mEq/L    Potassium 3.8  3.5 - 5.1 mEq/L    Chloride 107  96 - 112 mEq/L    CO2 23  19 - 32 mEq/L    Glucose, Bld 91  70 - 99 mg/dL    BUN 16  6 - 23 mg/dL    Creatinine, Ser 2.13  0.50 - 1.10 mg/dL    Calcium 9.2  8.4 - 08.6 mg/dL    Total Protein 6.5  6.0 - 8.3 g/dL    Albumin 3.5  3.5 - 5.2 g/dL    AST 19  0 - 37 U/L    ALT 13  0 - 35 U/L    Alkaline Phosphatase 89  39 - 117 U/L    Total Bilirubin 0.3  0.3 - 1.2 mg/dL    GFR calc non Af Amer 79 (*) >90 mL/min    GFR calc Af Amer >90  >90 mL/min   CBC     Status: Abnormal   Collection Time   08/19/11  6:40 AM      Component Value Range Comment   WBC 8.2  4.0 - 10.5 K/uL    RBC 3.91  3.87 - 5.11 MIL/uL    Hemoglobin 11.8 (*) 12.0 - 15.0 g/dL    HCT 57.8  46.9 - 62.9 %    MCV 93.1  78.0 - 100.0 fL    MCH 30.2  26.0 - 34.0 pg    MCHC 32.4  30.0 - 36.0 g/dL    RDW 52.8  41.3 - 24.4 %    Platelets 305  150 - 400 K/uL   CARDIAC PANEL(CRET KIN+CKTOT+MB+TROPI)     Status: Normal   Collection Time   08/19/11  9:05 AM      Component Value Range Comment   Total CK 152  7 - 177 U/L    CK, MB 2.7  0.3 - 4.0  ng/mL    Troponin I <0.30  <0.30 ng/mL    Relative Index 1.8  0.0 - 2.5     Ct Head Wo Contrast  08/18/2011  *RADIOLOGY REPORT*  Clinical Data: Headaches  CT HEAD WITHOUT CONTRAST  Technique:  Contiguous axial images were obtained from the base of the skull through the vertex without contrast.  Comparison: 02/20/2011  Findings:   There is prominence of the sulci and ventricles.  The brain has an otherwise normal appearance without evidence for hemorrhage, infarction, hydrocephalus, or mass lesion.  There is no extra axial fluid collection.  The skull and paranasal sinuses are normal.  IMPRESSION:  1.  No acute intracranial abnormalities.  Original Report  Authenticated By: Rosealee Albee, M.D.   Mr Laqueta Jean Wo Contrast  08/19/2011  *RADIOLOGY REPORT*  Clinical Data: Multiple recent syncopal episodes.  Atrial fibrillation.  MRI HEAD WITHOUT AND WITH CONTRAST  Technique:  Multiplanar, multiecho pulse sequences of the brain and surrounding structures were obtained according to standard protocol without and with intravenous contrast  Contrast: 20mL MULTIHANCE GADOBENATE DIMEGLUMINE 529 MG/ML IV SOLN  Comparison: CT head without contrast 08/18/2011.  Findings: No acute cortical infarct, hemorrhage, or mass lesion is present.  Scattered subcortical T2 and FLAIR hyperintensities are slightly greater than expected for age.  Flow is present in the major intracranial arteries.  The ventricles are of normal size.  No significant extra-axial fluid collection is present.  The globes and orbits are intact.  The paranasal sinuses are clear. There is some fluid in the right mastoid air cells.  No obstructing nasopharyngeal lesion is present.  The postcontrast images demonstrate no pathologic enhancement.  IMPRESSION:  1.  Scattered subcortical T2 and FLAIR hyperintensities are slightly exaggerated for age. The finding is nonspecific but can be seen in the setting of chronic microvascular ischemia, a demyelinating process such as multiple sclerosis, vasculitis, complicated migraine headaches, or as the sequelae of a prior infectious or inflammatory process. 2.  Right mastoid effusion.  No obstructing nasopharyngeal lesion is evident. 3.  No acute intracranial abnormality.  Original Report Authenticated By: Jamesetta Orleans. MATTERN, M.D.    ROS: Positive for obesity and takes OTC meds for this. Positive for fibromyalgia.  Sedentary.  Blood pressure 110/74, pulse 63, temperature 97.9 F (36.6 C), temperature source Oral, resp. rate 18, height 5\' 7"  (1.702 m), weight 213 lb (96.616 kg), SpO2 96.00%. The patient appears to be in no distress.  Head and neck exam reveals that the  pupils are equal and reactive.  The extraocular movements are full.  There is no scleral icterus.  Mouth and pharynx are benign.  No lymphadenopathy.  No carotid bruits.  The jugular venous pressure is normal.  Thyroid is not enlarged or tender.  Chest is clear to percussion and auscultation.  No rales or rhonchi.  Expansion of the chest is symmetrical.  Heart reveals no abnormal lift or heave.  First and second heart sounds are normal.  There is no murmur gallop rub or click.  The abdomen is soft and nontender.  Bowel sounds are normoactive.  There is no hepatosplenomegaly or mass.  There are no abdominal bruits. Old cholecystectomy scar.  Extremities reveal no phlebitis or edema.  Pedal pulses are good.  There is no cyanosis or clubbing.  Neurologic exam is normal strength and no lateralizing weakness.  No sensory deficits.  Integument reveals no rash  Chest xray not done.  Will order.  EKG  Shows NSR with RBBB  unchanged since 04/28/06  Assessment/Plan: 1.  Sporadic episodes of "passing out".  Yesterday's episode was documented to be in association with atrial fibrillation with rapid ventricular response, with spontaneous conversion to NSR. 2.  Paroxysmal atrial fib.  CHADSS 1.  The atrial fib by itself would   not be expected to cause syncope.  Rec:  Will request chest xray.            Will increase diltiazem to 300 mg daily.            Review 2D echo when available.            Possible discharge Sunday with consideration of outpatient 30 day event monitor to assess further.     Cassell Clement 08/19/2011, 4:00 PM

## 2011-08-19 NOTE — Progress Notes (Signed)
Pt admitted to having suicidal ideations. Pt stated she did not have a plan at this time. Md on call made aware. Sitter at bed side. Will continue to monitor pt.

## 2011-08-19 NOTE — Consult Note (Signed)
Reason for Consult:syncope, axniety Referring Physician: unknown  Connie Jackson is an 56 y.o. female.  HPI:  A 56 year old female with multiple medical problems who had episodes of extra fibrillation and was seen by her cardiologist presenting to the hospital with episodes of passing out. These have actually been going on for about a week and she's passed out at least 4-5 times. She has long-standing history of migraine headaches since the 1980s. Back in the 80s, she had a scan that showed some brain lesion according to her. She was seen a neurologist at that time but has not seen neurologist since then. She has taken several different medications for the migraine but not helping. Jaundice episodes the patient was having she has been staring ahead according to the husband followed by complete loss of consciousness lasted a few minutes and then she will regain it thereafter. Most of the time the headache will follow the episodes. She has also been claustrophobic worried having shortness of breath and is sweating and at the time of these episodes. No tonic-clonic seizures. No post ictal symptoms.  Reports long hx of having these spells. Has multiple stressors now like living alone at home, living conditions are not good, less time with her husband, limited friends and family, worries too much about different things also because she is alone most of the time. Denies any depressive symptoms but started crying during interview. Most recent stress is the death of her friend 9last week) who was very close to her and her husband. Denies manic episodes but has mood swings and anger issues in past. Reports being on SSRI in past but not now. Reported wt gain with these meds.  Past Medical History  Diagnosis Date  . Depression   . Migraine   . Hyperlipidemia   . Asthma   . IBS (irritable bowel syndrome)   . Fibromyalgia    Was dx with bipolar in past. Multiple SI attempts in past Past Surgical History    Procedure Date  . Cholecystectomy   . Oophorectomy   . Tonsillectomy   . Appendectomy   . Cesarean section   . Abdominal hysterectomy     Family History  Problem Relation Age of Onset  . Cancer Father   . Heart disease Mother     atrial fibrillation and CHF  . Heart disease Father     atrial fibrillation and CHF    Social History:  reports that she has never smoked. She does not have any smokeless tobacco history on file. She reports that she drinks alcohol. Her drug history not on file.  Allergies:  Allergies  Allergen Reactions  . Dht (Dihydrotachysterol) Dermatitis    Scar at injection site  . Penicillins     Almost died  . Promethazine Hcl   . Sulfa Antibiotics     Medications: I have reviewed the patient's current medications.  Results for orders placed during the hospital encounter of 08/18/11 (from the past 48 hour(s))  CBC WITH DIFFERENTIAL     Status: Abnormal   Collection Time   08/18/11  6:36 PM      Component Value Range Comment   WBC 10.5  4.0 - 10.5 K/uL    RBC 4.21  3.87 - 5.11 MIL/uL    Hemoglobin 13.2  12.0 - 15.0 g/dL    HCT 65.7  84.6 - 96.2 %    MCV 92.9  78.0 - 100.0 fL    MCH 31.4  26.0 - 34.0 pg  MCHC 33.8  30.0 - 36.0 g/dL    RDW 40.1  02.7 - 25.3 %    Platelets 341  150 - 400 K/uL    Neutrophils Relative 76  43 - 77 %    Neutro Abs 8.0 (*) 1.7 - 7.7 K/uL    Lymphocytes Relative 16  12 - 46 %    Lymphs Abs 1.7  0.7 - 4.0 K/uL    Monocytes Relative 6  3 - 12 %    Monocytes Absolute 0.6  0.1 - 1.0 K/uL    Eosinophils Relative 1  0 - 5 %    Eosinophils Absolute 0.1  0.0 - 0.7 K/uL    Basophils Relative 0  0 - 1 %    Basophils Absolute 0.0  0.0 - 0.1 K/uL   COMPREHENSIVE METABOLIC PANEL     Status: Abnormal   Collection Time   08/18/11  6:36 PM      Component Value Range Comment   Sodium 142  135 - 145 mEq/L    Potassium 4.2  3.5 - 5.1 mEq/L    Chloride 106  96 - 112 mEq/L    CO2 28  19 - 32 mEq/L    Glucose, Bld 107 (*) 70 - 99  mg/dL    BUN 19  6 - 23 mg/dL    Creatinine, Ser 6.64  0.50 - 1.10 mg/dL    Calcium 9.6  8.4 - 40.3 mg/dL    Total Protein 7.6  6.0 - 8.3 g/dL    Albumin 4.0  3.5 - 5.2 g/dL    AST 20  0 - 37 U/L    ALT 15  0 - 35 U/L    Alkaline Phosphatase 104  39 - 117 U/L    Total Bilirubin 0.3  0.3 - 1.2 mg/dL    GFR calc non Af Amer 75 (*) >90 mL/min    GFR calc Af Amer 87 (*) >90 mL/min   POCT I-STAT TROPONIN I     Status: Normal   Collection Time   08/18/11  6:53 PM      Component Value Range Comment   Troponin i, poc 0.00  0.00 - 0.08 ng/mL    Comment 3            POCT I-STAT TROPONIN I     Status: Normal   Collection Time   08/18/11  7:11 PM      Component Value Range Comment   Troponin i, poc 0.01  0.00 - 0.08 ng/mL    Comment 3            URINALYSIS, ROUTINE W REFLEX MICROSCOPIC     Status: Normal   Collection Time   08/18/11  7:25 PM      Component Value Range Comment   Color, Urine YELLOW  YELLOW    APPearance CLEAR  CLEAR    Specific Gravity, Urine 1.008  1.005 - 1.030    pH 7.0  5.0 - 8.0    Glucose, UA NEGATIVE  NEGATIVE mg/dL    Hgb urine dipstick NEGATIVE  NEGATIVE    Bilirubin Urine NEGATIVE  NEGATIVE    Ketones, ur NEGATIVE  NEGATIVE mg/dL    Protein, ur NEGATIVE  NEGATIVE mg/dL    Urobilinogen, UA 0.2  0.0 - 1.0 mg/dL    Nitrite NEGATIVE  NEGATIVE    Leukocytes, UA NEGATIVE  NEGATIVE MICROSCOPIC NOT DONE ON URINES WITH NEGATIVE PROTEIN, BLOOD, LEUKOCYTES, NITRITE, OR GLUCOSE <1000 mg/dL.  CBC  Status: Normal   Collection Time   08/19/11  1:53 AM      Component Value Range Comment   WBC 7.5  4.0 - 10.5 K/uL    RBC 4.91  3.87 - 5.11 MIL/uL    Hemoglobin 15.0  12.0 - 15.0 g/dL    HCT 16.1  09.6 - 04.5 %    MCV 92.9  78.0 - 100.0 fL    MCH 30.5  26.0 - 34.0 pg    MCHC 32.9  30.0 - 36.0 g/dL    RDW 40.9  81.1 - 91.4 %    Platelets 278  150 - 400 K/uL   CREATININE, SERUM     Status: Normal   Collection Time   08/19/11  1:53 AM      Component Value Range Comment    Creatinine, Ser 0.79  0.50 - 1.10 mg/dL    GFR calc non Af Amer >90  >90 mL/min    GFR calc Af Amer >90  >90 mL/min   TSH     Status: Normal   Collection Time   08/19/11  1:53 AM      Component Value Range Comment   TSH 2.085  0.350 - 4.500 uIU/mL   CARDIAC PANEL(CRET KIN+CKTOT+MB+TROPI)     Status: Abnormal   Collection Time   08/19/11  1:53 AM      Component Value Range Comment   Total CK 181 (*) 7 - 177 U/L    CK, MB 3.2  0.3 - 4.0 ng/mL    Troponin I <0.30  <0.30 ng/mL    Relative Index 1.8  0.0 - 2.5   COMPREHENSIVE METABOLIC PANEL     Status: Abnormal   Collection Time   08/19/11  6:40 AM      Component Value Range Comment   Sodium 142  135 - 145 mEq/L    Potassium 3.8  3.5 - 5.1 mEq/L    Chloride 107  96 - 112 mEq/L    CO2 23  19 - 32 mEq/L    Glucose, Bld 91  70 - 99 mg/dL    BUN 16  6 - 23 mg/dL    Creatinine, Ser 7.82  0.50 - 1.10 mg/dL    Calcium 9.2  8.4 - 95.6 mg/dL    Total Protein 6.5  6.0 - 8.3 g/dL    Albumin 3.5  3.5 - 5.2 g/dL    AST 19  0 - 37 U/L    ALT 13  0 - 35 U/L    Alkaline Phosphatase 89  39 - 117 U/L    Total Bilirubin 0.3  0.3 - 1.2 mg/dL    GFR calc non Af Amer 79 (*) >90 mL/min    GFR calc Af Amer >90  >90 mL/min   CBC     Status: Abnormal   Collection Time   08/19/11  6:40 AM      Component Value Range Comment   WBC 8.2  4.0 - 10.5 K/uL    RBC 3.91  3.87 - 5.11 MIL/uL    Hemoglobin 11.8 (*) 12.0 - 15.0 g/dL    HCT 21.3  08.6 - 57.8 %    MCV 93.1  78.0 - 100.0 fL    MCH 30.2  26.0 - 34.0 pg    MCHC 32.4  30.0 - 36.0 g/dL    RDW 46.9  62.9 - 52.8 %    Platelets 305  150 - 400 K/uL   CARDIAC PANEL(CRET KIN+CKTOT+MB+TROPI)  Status: Normal   Collection Time   08/19/11  9:05 AM      Component Value Range Comment   Total CK 152  7 - 177 U/L    CK, MB 2.7  0.3 - 4.0 ng/mL    Troponin I <0.30  <0.30 ng/mL    Relative Index 1.8  0.0 - 2.5     Ct Head Wo Contrast  08/18/2011  *RADIOLOGY REPORT*  Clinical Data: Headaches  CT HEAD WITHOUT  CONTRAST  Technique:  Contiguous axial images were obtained from the base of the skull through the vertex without contrast.  Comparison: 02/20/2011  Findings:   There is prominence of the sulci and ventricles.  The brain has an otherwise normal appearance without evidence for hemorrhage, infarction, hydrocephalus, or mass lesion.  There is no extra axial fluid collection.  The skull and paranasal sinuses are normal.  IMPRESSION:  1.  No acute intracranial abnormalities.  Original Report Authenticated By: Rosealee Albee, M.D.   Mr Laqueta Jean Wo Contrast  08/19/2011  *RADIOLOGY REPORT*  Clinical Data: Multiple recent syncopal episodes.  Atrial fibrillation.  MRI HEAD WITHOUT AND WITH CONTRAST  Technique:  Multiplanar, multiecho pulse sequences of the brain and surrounding structures were obtained according to standard protocol without and with intravenous contrast  Contrast: 20mL MULTIHANCE GADOBENATE DIMEGLUMINE 529 MG/ML IV SOLN  Comparison: CT head without contrast 08/18/2011.  Findings: No acute cortical infarct, hemorrhage, or mass lesion is present.  Scattered subcortical T2 and FLAIR hyperintensities are slightly greater than expected for age.  Flow is present in the major intracranial arteries.  The ventricles are of normal size.  No significant extra-axial fluid collection is present.  The globes and orbits are intact.  The paranasal sinuses are clear. There is some fluid in the right mastoid air cells.  No obstructing nasopharyngeal lesion is present.  The postcontrast images demonstrate no pathologic enhancement.  IMPRESSION:  1.  Scattered subcortical T2 and FLAIR hyperintensities are slightly exaggerated for age. The finding is nonspecific but can be seen in the setting of chronic microvascular ischemia, a demyelinating process such as multiple sclerosis, vasculitis, complicated migraine headaches, or as the sequelae of a prior infectious or inflammatory process. 2.  Right mastoid effusion.  No  obstructing nasopharyngeal lesion is evident. 3.  No acute intracranial abnormality.  Original Report Authenticated By: Jamesetta Orleans. MATTERN, M.D.    ROS Blood pressure 110/74, pulse 63, temperature 97.9 F (36.6 C), temperature source Oral, resp. rate 18, height 5\' 7"  (1.702 m), weight 96.616 kg (213 lb), SpO2 96.00%. Physical Exam  Alert on bed  Mental Status Examination/Evaluation:  Appearance: on bed  Eye Contact:: Good  Speech: normal  Volume: Normal  Mood: depressed  Affect: broad  Thought Process: organized  Orientation: Full  Thought Content: NO AVH  Suicidal Thoughts: No  Homicidal Thoughts: no  Memory: Recent; Poor  Judgement: Impaired  Insight: Lacking  Psychomotor Activity: Normal  Concentration: Fair  Recall: Fair  Akathisia: No  Assessment:  AXIS I: , anxiety d/o nos, r/o conversion d/o, r/o Depressive d/o nos  AXIS II: Deferred  AXIS III: see emdical hx ? ?  ? ?  ?  ? ?  ?  ?  AXIS IV: problems related to social environment, limited social support  AXIS V: 45  ?  Treatment Plan/Recommendations:  1. Pt denies any SI now or before admission. husband is not concerned about the safety either at this time  2. Will recommend Neurology work  up as put pt and also Psy out including therapy pt based on her complex hx. Will not recommend to start any new anxiety meds either at this time for same reason. Pt will get benefit if her anxiety is under control  3. Pt can be continued to her current meds for anxiety  2. Will continue to follow  Wonda Cerise 08/19/2011, 3:50 PM

## 2011-08-19 NOTE — Consult Note (Signed)
Reason for Consult: Syncope Referring Physician:  Dr. Erenest Rasher  CC: Syncopal episodes    HPI:  This is Ms.  Connie Jackson is an 56 y.o.   RHWF with long history of  Psychiatry disorders included but limited to Bipolar I, Manic depressive disorder, Depression, Anxiety disorder,  OCD, Borderline disorder, chronic pain syndrome, fibromyalgia  And has made several suicide attempts in the past  X3  ( one time overdose on her mothers medications, second time by  Consumption of chlorine bleach,) she has been in a long term psych facility.   She has been on Lithium, zoloft, prozac,  Elavil, valproic acid, Effexor, risperdal, and the list continues. She is resting in bed and gives me a long convoluted history of her psych issues with great pride. She  Mentions she also has anger issues  She stopped taking all her psych medications because she was gaining weight. Finally she has started losing some weight after she came off all her psych meds.  She gets easily frustrated during the interview She is tangent while I ask a direct question.  Lately she has been having syncopal episodes, she is on beta blocker as well as several antihypertensive medications and multiple otc nutritional supplements ( some of them she cant remember) Today she was at the doctors office and had a near syncope and was found to have atrial flutter.   According to the husband and the patient she does not have tongue biting episodes, no convulsions, no bowel incontinent. She has prolapsed bladder due to which she has constant leaking other than that during episodes she does not have bladder incontinent. She has no weakness, or sensory loss after the episodes, she has a known history of headaches  And according to her when she Head CT done in the past they had identified with one large Golf ball size lesions on the right temporal area but MRI failed to show that.   According to the husband prior to syncopal episodes, she has  palpitations, gets flushed, with some autonomic dysfunction. When she wakes up she does not have confusion or disorientation, no fatigue but has aphemia.   According to the husband she gets frustrated and annoyed it precipitates. Patient agrees to all her psych issues. She also mentions that she has been really upset because  She is here to attend a funeral of her husband;s  best friend.   Her story is random and she jumps from topic to topic without arriving to the point.  She complaints of chest pain, sob, nausea, vomiting, dizziness, palpitations, anxiety, abdominal pain, leg pain, back pain, insomnia, she complaints that " she cannot shut her mind its like running all the time" she responds to "yes" to all  Review of systems. She refuses to overdose of xanax and or missing xanax.  I told her that with so many psych issues and cardiac history, these episodes are less likely to be seizures,  She gets upsets and confronts that she would rather have the diagnosis of seizures and get it over with and be treated    Past Medical History  Diagnosis Date  . Depression   . Migraine   . Hyperlipidemia   . Asthma   . IBS (irritable bowel syndrome)   . Fibromyalgia     .  Anxiety disorder .   Bipolar 1 . Borderline personality disorder  Obesity Syncope  Cardiac arrythmia Suicide attempts X3 Insominia Prolapsed Bladder Recurrent UTI   Past Surgical History  Procedure Date  . Cholecystectomy   . Oophorectomy   . Tonsillectomy   . Appendectomy   . Cesarean section   . Abdominal hysterectomy     Family History  Problem Relation Age of Onset  . Cancer Father   . Heart disease Mother     atrial fibrillation and CHF  . Heart disease Father     atrial fibrillation and CHF    Mother, father, daughter and brothers with Bipolar and manic depressive disorder Her mother was also in a long term psych facility    Social History:No smoking, alcohol or illicit drugs.   Allergies  Allergen  Reactions  . Dht (Dihydrotachysterol) Dermatitis    Scar at injection site  . Penicillins     Almost died  . Promethazine Hcl   . Sulfa Antibiotics     Medications:  Reviewed  ROS: Almost positive for everything  Physical Examination: Blood pressure 134/85, pulse 75, temperature 98 F (36.7 C), temperature source Oral, resp. rate 18, height 5\' 7"  (1.702 m), weight 96.616 kg (213 lb), SpO2 96.00%.    General: Patient is awake, alert and follows commands, not in commands.  HEENT: EOMI, PEERLA, no neglect, no ptosis, no lid lag,  Neck: Supple, no JVD, no carotid bruit Lungs; CTA Heart: RRR, no murmurs, rubs and or gallops Abdomen: bowel sounds present, soft, non tender, non distended Skin: No rash Extremities; no edema  Neuro Mental status: AAO follows commands.  Attention: Poor Thought processes: intact Insight: poor Mood: Anxious Visual spatial skills: intact No hallucinations No delusions Memory: intact  She is fixated that she is having seizures  Cranial Nerves: Grossly intact Motor: 5/5 in UE and LE b/l, proximal and distal Reflexes: 2+ throughout Plantar : downgoing No rigidity , spasticity or muscle wasting  Sensory : intact to pin prick, temp, and vibration Cerebellar signs: intact Gait normal Extrapyramidal : negative  Laboratory Studies:   Basic Metabolic Panel:  Lab 08/18/11 1610  NA 142  K 4.2  CL 106  CO2 28  GLUCOSE 107*  BUN 19  CREATININE 0.86  CALCIUM 9.6  MG --  PHOS --    Liver Function Tests:  Lab 08/18/11 1836  AST 20  ALT 15  ALKPHOS 104  BILITOT 0.3  PROT 7.6  ALBUMIN 4.0   No results found for this basename: LIPASE:5,AMYLASE:5 in the last 168 hours No results found for this basename: AMMONIA:3 in the last 168 hours  CBC:  Lab 08/18/11 1836  WBC 10.5  NEUTROABS 8.0*  HGB 13.2  HCT 39.1  MCV 92.9  PLT 341    Cardiac Enzymes: No results found for this basename: CKTOTAL:5,CKMB:5,CKMBINDEX:5,TROPONINI:5 in  the last 168 hours  BNP: No components found with this basename: POCBNP:5  CBG: No results found for this basename: GLUCAP:5 in the last 168 hours  Microbiology: No results found for this or any previous visit.  Coagulation Studies: No results found for this basename: LABPROT:5,INR:5 in the last 72 hours  Urinalysis:  Lab 08/18/11 1925  COLORURINE YELLOW  LABSPEC 1.008  PHURINE 7.0  GLUCOSEU NEGATIVE  HGBUR NEGATIVE  BILIRUBINUR NEGATIVE  KETONESUR NEGATIVE  PROTEINUR NEGATIVE  UROBILINOGEN 0.2  NITRITE NEGATIVE  LEUKOCYTESUR NEGATIVE    Lipid Panel:  No results found for this basename: chol, trig, hdl, cholhdl, vldl, ldlcalc    HgbA1C:  No results found for this basename: HGBA1C    Urine Drug Screen:   No results found for this basename: labopia, cocainscrnur, labbenz, amphetmu, thcu,  labbarb    Alcohol Level: No results found for this basename: ETH:2 in the last 168 hours  Other results: EKG: normal EKG, normal sinus rhythm, unchanged from previous tracings.  Imaging: Ct Head Wo Contrast  08/18/2011  *RADIOLOGY REPORT*  Clinical Data: Headaches  CT HEAD WITHOUT CONTRAST  Technique:  Contiguous axial images were obtained from the base of the skull through the vertex without contrast.  Comparison: 02/20/2011  Findings:   There is prominence of the sulci and ventricles.  The brain has an otherwise normal appearance without evidence for hemorrhage, infarction, hydrocephalus, or mass lesion.  There is no extra axial fluid collection.  The skull and paranasal sinuses are normal.  IMPRESSION:  1.  No acute intracranial abnormalities.  Original Report Authenticated By: Rosealee Albee, M.D.     Assessment/Plan:   56 y/o Female with Bipolar disorder, Manic depressive, anxiety, fibromyalgia, borderline personality, and several attempts of killing herself for attention ( she mentions that she believed that her husband did not love her after she had her 2 kids) Has  recurrent syncopal episodes that varies in duration and symptoms, they are never the similar in nature, but there is no violent convulsions, no tongue biting, and eyes are CLOSED during every episodes.  Has cardiac arrythmias.  She is also on multiple OTC vitamins. She has complaints about everything  1) Cardiogenic syncope 2) Psychogenic syncope 3) Pseuodseizures 4) Antihypertensive medications 5) Frontal lobe seizures (less likely) 6) Malingering   Recommendations:  1) C/W psych as an outpatient 2) Outpatient EEG 3) No Antiepileptic seizures 4) If this events continue patient can be admitted for Epilepsy monitoring unit.   Outpatient neurology work up.   Thank you for the consult

## 2011-08-19 NOTE — Progress Notes (Signed)
Notified Elray Mcgregor, NP about BP 99/62 and tonight's dose of Lopressor 25mg . Orders to hold tonight's dose. Will re-evaluate in the am. Will continue to monitor. Earnest Conroy RN

## 2011-08-20 MED ORDER — DILTIAZEM HCL ER COATED BEADS 300 MG PO CP24: 300.0000 mg | ORAL_CAPSULE | Freq: Every day | ORAL | Status: DC

## 2011-08-20 MED ORDER — ALPRAZOLAM 0.5 MG PO TABS: 1.0000 mg | ORAL_TABLET | Freq: Two times a day (BID) | ORAL | Status: DC | PRN

## 2011-08-20 MED ORDER — METOPROLOL TARTRATE 25 MG PO TABS: 25.0000 mg | ORAL_TABLET | Freq: Every day | ORAL | Status: DC

## 2011-08-20 NOTE — Progress Notes (Signed)
@   Subjective:  Denies CP or dyspnea   Objective:  Filed Vitals:   08/19/11 2000 08/20/11 0000 08/20/11 0400 08/20/11 0931  BP: 99/62 94/47 90/51  113/69  Pulse: 64 64 65   Temp: 98.2 F (36.8 C) 97.7 F (36.5 C) 97.9 F (36.6 C)   TempSrc: Oral Oral Oral   Resp: 16 15 15    Height:      Weight:      SpO2: 96% 96% 97%     Intake/Output from previous day:  Intake/Output Summary (Last 24 hours) at 08/20/11 1104 Last data filed at 08/20/11 0929  Gross per 24 hour  Intake   1203 ml  Output      0 ml  Net   1203 ml    Physical Exam: Physical exam: Well-developed well-nourished in no acute distress.  Skin is warm and dry.  HEENT is normal.  Neck is supple.  Chest is clear to auscultation with normal expansion.  Cardiovascular exam is regular rate and rhythm.  Abdominal exam nontender or distended. No masses palpated. Extremities show no edema. neuro grossly intact    Lab Results: Basic Metabolic Panel:  Basename 08/19/11 0640 08/19/11 0153 08/18/11 1836  NA 142 -- 142  K 3.8 -- 4.2  CL 107 -- 106  CO2 23 -- 28  GLUCOSE 91 -- 107*  BUN 16 -- 19  CREATININE 0.82 0.79 --  CALCIUM 9.2 -- 9.6  MG -- -- --  PHOS -- -- --   CBC:  Basename 08/19/11 0640 08/19/11 0153 08/18/11 1836  WBC 8.2 7.5 --  NEUTROABS -- -- 8.0*  HGB 11.8* 15.0 --  HCT 36.4 45.6 --  MCV 93.1 92.9 --  PLT 305 278 --   Cardiac Enzymes:  Basename 08/19/11 1801 08/19/11 0905 08/19/11 0153  CKTOTAL 130 152 181*  CKMB 2.5 2.7 3.2  CKMBINDEX -- -- --  TROPONINI <0.30 <0.30 <0.30     Assessment/Plan:  1) syncope - Etiology unclear; unlikely to be cardiac (patient did not lose pulse with episode in office and BP documented to be 170/100). She also states she is typically unconscious for 20-30 min which makes cardiac syncope very unlikely. Atrial fibrillation typically not associated with syncope unless patient hypotensive. Agree with continued neurology evaluation. 2) Atrial fibrillation -  continue cardizem and beta blocker. We will sign off; please call with questions.  Olga Millers 08/20/2011, 11:04 AM

## 2011-08-20 NOTE — Discharge Summary (Addendum)
Physician Discharge Summary  Connie Jackson MRN: [id number] DOB/AGE: [birth year] 56 y.o.  PCP: Paulina Fusi, MD   Admit date: 08/18/2011 Discharge date: 08/20/2011  Discharge Diagnoses:     Palpitations  Hyperlipidemia  Atrial fibrillation  Syncope  Migraine headache   Medication List  As of 08/20/2011  9:00 AM   TAKE these medications         ALPRAZolam 0.5 MG tablet   Commonly known as: XANAX   Take 2 tablets (1 mg total) by mouth 2 (two) times daily as needed. For anxiety      B COMPLETE PO   Take 2 tablets by mouth daily.      chlorthalidone 25 MG tablet   Commonly known as: HYGROTON   Take 25 mg by mouth daily as needed. For fluid      CO Q-10 PO   Take 2 capsules by mouth daily.      diltiazem 300 MG 24 hr capsule   Commonly known as: CARDIZEM CD   Take 1 capsule (300 mg total) by mouth daily.      eletriptan 40 MG tablet   Commonly known as: RELPAX   One tablet by mouth as needed for migraine headache.  If the headache improves and then returns, dose may be repeated after 2 hours have elapsed since first dose (do not exceed 80 mg per day). may repeat in 2 hours if necessary      fish oil-omega-3 fatty acids 1000 MG capsule   Take 2 g by mouth daily.      GARLIC OIL PO   Take 2 tablets by mouth daily.      HYDROcodone-acetaminophen 10-500 MG per tablet   Commonly known as: LORTAB   Take 0.5-1 tablets by mouth every 6 (six) hours as needed. For pain      metoprolol tartrate 25 MG tablet   Commonly known as: LOPRESSOR   Take 1 tablet (25 mg total) by mouth at bedtime.      nabumetone 750 MG tablet   Commonly known as: RELAFEN   Take 750 mg by mouth daily.      nortriptyline 25 MG capsule   Commonly known as: PAMELOR   Take 50 mg by mouth at bedtime.      OVER THE COUNTER MEDICATION   Take 2 capsules by mouth daily. lipozene and relacore for weight loss      oxyCODONE-acetaminophen 7.5-325 MG per tablet   Commonly known as: PERCOCET   Take 0.5-1 tablets by mouth at bedtime as needed. For pain      solifenacin 5 MG tablet   Commonly known as: VESICARE   Take 5 mg by mouth daily.      tiZANidine 4 MG capsule   Commonly known as: ZANAFLEX   Take 4 mg by mouth at bedtime.            Discharge Condition: Stable  Disposition: Final discharge disposition not confirmed   Consults:  #1 neurology #2 cardiology #3 psychiatry  Significant Diagnostic Studies: Dg Chest 2 View  08/19/2011  *RADIOLOGY REPORT*  Clinical Data: Paroxysmal atrial fibrillation and dyspnea.  CHEST - 2 VIEW  Comparison: Two views of the thoracic spine 02/20/2011 and portable chest 09/24/2010.  Findings: Mild cardiac enlargement is stable.  Mild chronic pulmonary vascular congestion is evident.  There is no edema or effusion to suggest failure.  The visualized soft tissues and bony thorax are unremarkable.  IMPRESSION:  1.  Stable mild cardiomegaly  and pulmonary vascular congestion. 2.  No evidence for congestive heart failure.  Original Report Authenticated By: Jamesetta Orleans. MATTERN, M.D.   Ct Head Wo Contrast  08/18/2011  *RADIOLOGY REPORT*  Clinical Data: Headaches  CT HEAD WITHOUT CONTRAST  Technique:  Contiguous axial images were obtained from the base of the skull through the vertex without contrast.  Comparison: 02/20/2011  Findings:   There is prominence of the sulci and ventricles.  The brain has an otherwise normal appearance without evidence for hemorrhage, infarction, hydrocephalus, or mass lesion.  There is no extra axial fluid collection.  The skull and paranasal sinuses are normal.  IMPRESSION:  1.  No acute intracranial abnormalities.  Original Report Authenticated By: Rosealee Albee, M.D.   Mr Laqueta Jean Wo Contrast  08/19/2011  *RADIOLOGY REPORT*  Clinical Data: Multiple recent syncopal episodes.  Atrial fibrillation.  MRI HEAD WITHOUT AND WITH CONTRAST  Technique:  Multiplanar, multiecho pulse sequences of the brain and surrounding  structures were obtained according to standard protocol without and with intravenous contrast  Contrast: 20mL MULTIHANCE GADOBENATE DIMEGLUMINE 529 MG/ML IV SOLN  Comparison: CT head without contrast 08/18/2011.  Findings: No acute cortical infarct, hemorrhage, or mass lesion is present.  Scattered subcortical T2 and FLAIR hyperintensities are slightly greater than expected for age.  Flow is present in the major intracranial arteries.  The ventricles are of normal size.  No significant extra-axial fluid collection is present.  The globes and orbits are intact.  The paranasal sinuses are clear. There is some fluid in the right mastoid air cells.  No obstructing nasopharyngeal lesion is present.  The postcontrast images demonstrate no pathologic enhancement.  IMPRESSION:  1.  Scattered subcortical T2 and FLAIR hyperintensities are slightly exaggerated for age. The finding is nonspecific but can be seen in the setting of chronic microvascular ischemia, a demyelinating process such as multiple sclerosis, vasculitis, complicated migraine headaches, or as the sequelae of a prior infectious or inflammatory process. 2.  Right mastoid effusion.  No obstructing nasopharyngeal lesion is evident. 3.  No acute intracranial abnormality.  Original Report Authenticated By: Jamesetta Orleans. MATTERN, M.D.   2-D echo done results pending at this time    Microbiology: No results found for this or any previous visit (from the past 240 hour(s)).   Labs: Results for orders placed during the hospital encounter of 08/18/11 (from the past 48 hour(s))  CBC WITH DIFFERENTIAL     Status: Abnormal   Collection Time   08/18/11  6:36 PM      Component Value Range Comment   WBC 10.5  4.0 - 10.5 K/uL    RBC 4.21  3.87 - 5.11 MIL/uL    Hemoglobin 13.2  12.0 - 15.0 g/dL    HCT 16.1  09.6 - 04.5 %    MCV 92.9  78.0 - 100.0 fL    MCH 31.4  26.0 - 34.0 pg    MCHC 33.8  30.0 - 36.0 g/dL    RDW 40.9  81.1 - 91.4 %    Platelets 341  150 -  400 K/uL    Neutrophils Relative 76  43 - 77 %    Neutro Abs 8.0 (*) 1.7 - 7.7 K/uL    Lymphocytes Relative 16  12 - 46 %    Lymphs Abs 1.7  0.7 - 4.0 K/uL    Monocytes Relative 6  3 - 12 %    Monocytes Absolute 0.6  0.1 - 1.0 K/uL    Eosinophils  Relative 1  0 - 5 %    Eosinophils Absolute 0.1  0.0 - 0.7 K/uL    Basophils Relative 0  0 - 1 %    Basophils Absolute 0.0  0.0 - 0.1 K/uL   COMPREHENSIVE METABOLIC PANEL     Status: Abnormal   Collection Time   08/18/11  6:36 PM      Component Value Range Comment   Sodium 142  135 - 145 mEq/L    Potassium 4.2  3.5 - 5.1 mEq/L    Chloride 106  96 - 112 mEq/L    CO2 28  19 - 32 mEq/L    Glucose, Bld 107 (*) 70 - 99 mg/dL    BUN 19  6 - 23 mg/dL    Creatinine, Ser 1.61  0.50 - 1.10 mg/dL    Calcium 9.6  8.4 - 09.6 mg/dL    Total Protein 7.6  6.0 - 8.3 g/dL    Albumin 4.0  3.5 - 5.2 g/dL    AST 20  0 - 37 U/L    ALT 15  0 - 35 U/L    Alkaline Phosphatase 104  39 - 117 U/L    Total Bilirubin 0.3  0.3 - 1.2 mg/dL    GFR calc non Af Amer 75 (*) >90 mL/min    GFR calc Af Amer 87 (*) >90 mL/min   POCT I-STAT TROPONIN I     Status: Normal   Collection Time   08/18/11  6:53 PM      Component Value Range Comment   Troponin i, poc 0.00  0.00 - 0.08 ng/mL    Comment 3            POCT I-STAT TROPONIN I     Status: Normal   Collection Time   08/18/11  7:11 PM      Component Value Range Comment   Troponin i, poc 0.01  0.00 - 0.08 ng/mL    Comment 3            URINALYSIS, ROUTINE W REFLEX MICROSCOPIC     Status: Normal   Collection Time   08/18/11  7:25 PM      Component Value Range Comment   Color, Urine YELLOW  YELLOW    APPearance CLEAR  CLEAR    Specific Gravity, Urine 1.008  1.005 - 1.030    pH 7.0  5.0 - 8.0    Glucose, UA NEGATIVE  NEGATIVE mg/dL    Hgb urine dipstick NEGATIVE  NEGATIVE    Bilirubin Urine NEGATIVE  NEGATIVE    Ketones, ur NEGATIVE  NEGATIVE mg/dL    Protein, ur NEGATIVE  NEGATIVE mg/dL    Urobilinogen, UA 0.2  0.0 - 1.0  mg/dL    Nitrite NEGATIVE  NEGATIVE    Leukocytes, UA NEGATIVE  NEGATIVE MICROSCOPIC NOT DONE ON URINES WITH NEGATIVE PROTEIN, BLOOD, LEUKOCYTES, NITRITE, OR GLUCOSE <1000 mg/dL.  CBC     Status: Normal   Collection Time   08/19/11  1:53 AM      Component Value Range Comment   WBC 7.5  4.0 - 10.5 K/uL    RBC 4.91  3.87 - 5.11 MIL/uL    Hemoglobin 15.0  12.0 - 15.0 g/dL    HCT 04.5  40.9 - 81.1 %    MCV 92.9  78.0 - 100.0 fL    MCH 30.5  26.0 - 34.0 pg    MCHC 32.9  30.0 - 36.0 g/dL  RDW 13.5  11.5 - 15.5 %    Platelets 278  150 - 400 K/uL   CREATININE, SERUM     Status: Normal   Collection Time   08/19/11  1:53 AM      Component Value Range Comment   Creatinine, Ser 0.79  0.50 - 1.10 mg/dL    GFR calc non Af Amer >90  >90 mL/min    GFR calc Af Amer >90  >90 mL/min   TSH     Status: Normal   Collection Time   08/19/11  1:53 AM      Component Value Range Comment   TSH 2.085  0.350 - 4.500 uIU/mL   CARDIAC PANEL(CRET KIN+CKTOT+MB+TROPI)     Status: Abnormal   Collection Time   08/19/11  1:53 AM      Component Value Range Comment   Total CK 181 (*) 7 - 177 U/L    CK, MB 3.2  0.3 - 4.0 ng/mL    Troponin I <0.30  <0.30 ng/mL    Relative Index 1.8  0.0 - 2.5   COMPREHENSIVE METABOLIC PANEL     Status: Abnormal   Collection Time   08/19/11  6:40 AM      Component Value Range Comment   Sodium 142  135 - 145 mEq/L    Potassium 3.8  3.5 - 5.1 mEq/L    Chloride 107  96 - 112 mEq/L    CO2 23  19 - 32 mEq/L    Glucose, Bld 91  70 - 99 mg/dL    BUN 16  6 - 23 mg/dL    Creatinine, Ser 1.61  0.50 - 1.10 mg/dL    Calcium 9.2  8.4 - 09.6 mg/dL    Total Protein 6.5  6.0 - 8.3 g/dL    Albumin 3.5  3.5 - 5.2 g/dL    AST 19  0 - 37 U/L    ALT 13  0 - 35 U/L    Alkaline Phosphatase 89  39 - 117 U/L    Total Bilirubin 0.3  0.3 - 1.2 mg/dL    GFR calc non Af Amer 79 (*) >90 mL/min    GFR calc Af Amer >90  >90 mL/min   CBC     Status: Abnormal   Collection Time   08/19/11  6:40 AM       Component Value Range Comment   WBC 8.2  4.0 - 10.5 K/uL    RBC 3.91  3.87 - 5.11 MIL/uL    Hemoglobin 11.8 (*) 12.0 - 15.0 g/dL    HCT 04.5  40.9 - 81.1 %    MCV 93.1  78.0 - 100.0 fL    MCH 30.2  26.0 - 34.0 pg    MCHC 32.4  30.0 - 36.0 g/dL    RDW 91.4  78.2 - 95.6 %    Platelets 305  150 - 400 K/uL   CARDIAC PANEL(CRET KIN+CKTOT+MB+TROPI)     Status: Normal   Collection Time   08/19/11  9:05 AM      Component Value Range Comment   Total CK 152  7 - 177 U/L    CK, MB 2.7  0.3 - 4.0 ng/mL    Troponin I <0.30  <0.30 ng/mL    Relative Index 1.8  0.0 - 2.5   CARDIAC PANEL(CRET KIN+CKTOT+MB+TROPI)     Status: Normal   Collection Time   08/19/11  6:01 PM      Component Value Range  Comment   Total CK 130  7 - 177 U/L    CK, MB 2.5  0.3 - 4.0 ng/mL    Troponin I <0.30  <0.30 ng/mL    Relative Index 1.9  0.0 - 2.5      HPI :56 y.o. female.  This patient has had a history of paroxysmal atrial fib. Saw Dr. [name] October 2012 and had normal echo at that time. Seen yesterday for a work-in visit by [name] NP for evaluation of recurrent "passing out spells". Had a spell at the office with loss of consciousness but no loss of pulse. Her pulse was fast and irregular in atrial fib at a rate of 150. BP 170/100. Given oxygen and IV fluid bolus in office and transferred here. She converted back to [facility] prior to transfer to [facility]. Patient states this spell was similar to previous spells. She has a feeling and a warning before she passes out. She is able to get to a soft spot so that she does not injure herself with a fall. Patient has past history of hypertension controlled with medication. No history of diabetes or CHF or stroke. CHADSS score is 1 (BP). She was admitted from the ER because of the above episode She also has a history of Bipolar I, Manic depressive disorder, Depression, Anxiety disorder, OCD, Borderline disorder, chronic pain syndrome, fibromyalgia And has made several suicide attempts  in the past  X3 ( one time overdose on her mothers medications, second time by Consumption of chlorine bleach,) she has been in a long term psych facility. She has been on Lithium, zoloft, prozac,  Elavil, valproic acid, Effexor, risperdal, and the list continues. She is resting in bed and gives me a long convoluted history of her psych issues with great pride. She Mentions she also has anger issues  She stopped taking all her psych medications because she was gaining weight. Finally she has started losing some weight after she came off all her psych meds. She gets easily frustrated during the interview  She is tangent while I ask a direct question.  Lately she has been having syncopal episodes, she is on beta blocker as well as several antihypertensive medications and multiple otc nutritional supplements ( some of them she cant remember)  Today she was at the doctors office and had a near syncope and was found to have atrial flutter. According to the husband and the patient she does not have tongue biting episodes, no convulsions, no bowel incontinent.  She has prolapsed bladder due to which she has constant leaking other than that during episodes she does not have bladder incontinent. She has no weakness, or sensory loss after the episodes, she has a known history of headaches And according to her when she Head CT done in the past they had identified with one large Golf ball size lesions on the right temporal area but MRI failed to show that. According to the husband prior to syncopal episodes, she has palpitations, gets flushed, with some autonomic dysfunction.  When she wakes up she does not have confusion or disorientation, no fatigue but has aphemia.  Her story is random and she jumps from topic to topic without arriving to the point.  She complaints of chest pain, sob, nausea, vomiting, dizziness, palpitations, anxiety, abdominal pain, leg pain, back pain, insomnia, she complaints that " she cannot  shut her mind its like running all the time" she responds to "yes" to all Review of systems. She refuses to  overdose of xanax and or missing xanax. I told her that with so many psych issues and cardiac history, these episodes are less likely to be seizures, She gets upsets and confronts that she would rather have the diagnosis of seizures and get it over with and be treated      HOSPITAL COURSE:  #1 probable cardiogenic syncope,   No documented seizures No acute coronary syndrome Found to have atrial fibrillation with rapid ventricular response converted spontaneously to normal sinus rhythm. Seen by cardiology, recommend increasing diltiazem to 300 mg daily 2-D echo ordered but not completed at this time Possible discharge today if okay with cardiology She would benefit from 30 day event monitor to assess further   #2 syncope differential could include psychogenic versus pseudoseizures versus malingering MRI showed some white matter changes appreciate urology input. Patient would benefit from urology followup outpatient. She would also need an outpatient EEG per neurology recommendations. No antiepileptic is prescribed at this time.  #3 fibromyalgia/borderline personality/manic depressive anxiety Psychiatric recommend being on antianxiety medication. To keep her anxiety under control. Next is she will need outpatient urology workup as well as outpatient psychiatric followup  Discharge Exam:  Blood pressure 90/51, pulse 65, temperature 97.9 F (36.6 C), temperature source Oral, resp. rate 15, height 5\' 7"  (1.702 m), weight 96.616 kg (213 lb), SpO2 97.00%.   Head and neck exam reveals that the pupils are equal and reactive. The extraocular movements are full. There is no scleral icterus. Mouth and pharynx are benign. No lymphadenopathy. No carotid bruits. The jugular venous pressure is normal. Thyroid is not enlarged or tender.  Chest is clear to percussion and auscultation. No rales or  rhonchi. Expansion of the chest is symmetrical.  Heart reveals no abnormal lift or heave. First and second heart sounds are normal. There is no murmur gallop rub or click.  The abdomen is soft and nontender. Bowel sounds are normoactive. There is no hepatosplenomegaly or mass. There are no abdominal bruits. Old cholecystectomy scar.  Extremities reveal no phlebitis or edema. Pedal pulses are good. There is no cyanosis or clubbing.  Neurologic exam is normal strength and no lateralizing weakness. No sensory deficits.        Follow-up Information    Follow up with [name], NP in 1 week. (Syncope, possible Holter)    Contact information:   [address] [place] [address] [phone]       Follow up with Primary care provider in 1 week.         SignedRicharda Overlie 08/20/2011, 9:00 AM

## 2011-09-05 NOTE — ED Provider Notes (Signed)
I saw and evaluated the patient, reviewed Dr Daleen Bo note and I agree with the findings and plan.     Loren Racer, MD 09/05/11 [phone]

## 2011-12-28 ENCOUNTER — Telehealth: Payer: Self-pay | Admitting: Cardiology

## 2011-12-28 ENCOUNTER — Other Ambulatory Visit: Payer: Self-pay | Admitting: *Deleted

## 2011-12-28 NOTE — Telephone Encounter (Signed)
Spoke with pharmacy gave pt 3 refills pt was unsure of what dose she is suppose to be on so i filled it how she got it last time and how its was written on last dictation which is 240mg  1 tab po qd

## 2011-12-28 NOTE — Telephone Encounter (Signed)
New Problem:    Patient called in needing a refill of her diltiazem (CARDIZEM CD) 300 MG 24 hr capsule filled the [facility] phone: [phone].  Please call back if you have any questions.

## 2012-10-17 IMAGING — CT CT HEAD W/O CM
1 of 2 series · 13 of 30 positions shown, 17 images · non-contrast
Comparison: 02/20/2011

CLINICAL DATA: Headaches

CT HEAD WITHOUT CONTRAST
TECHNIQUE: Contiguous axial images were obtained from the base of
the skull through the vertex without contrast.

[Series 2: brain · axial · 0.47mm/px · z∈[+123,+245]mm · 13 of 28 slices shown, 17 images]
[im 2/28  brain]
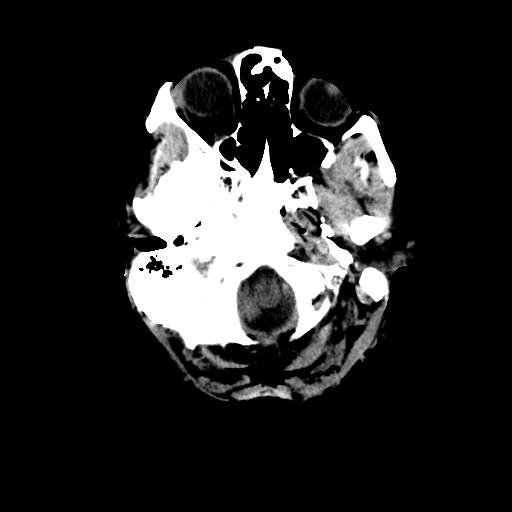
[im 2/28  bone]
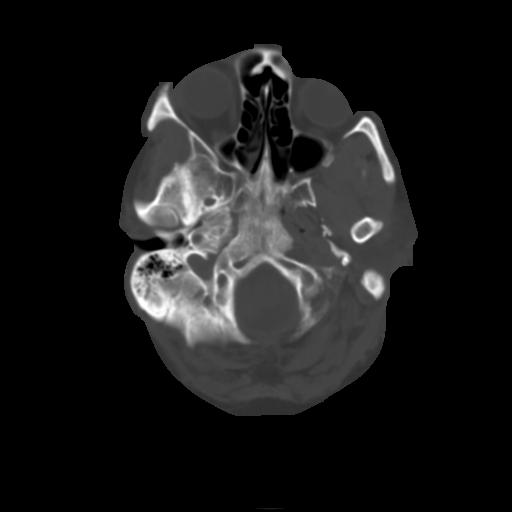
[im 4/28  brain]
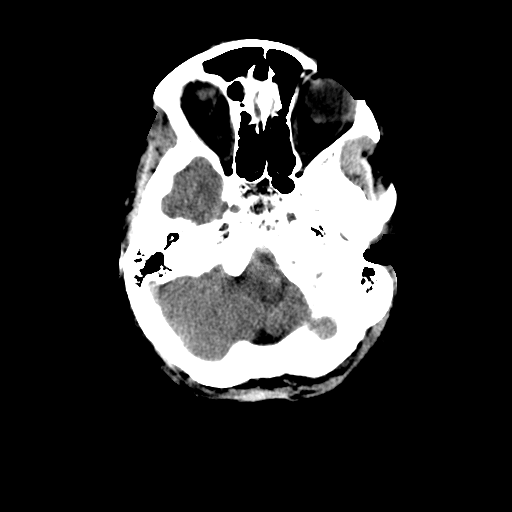
[im 6/28  brain]
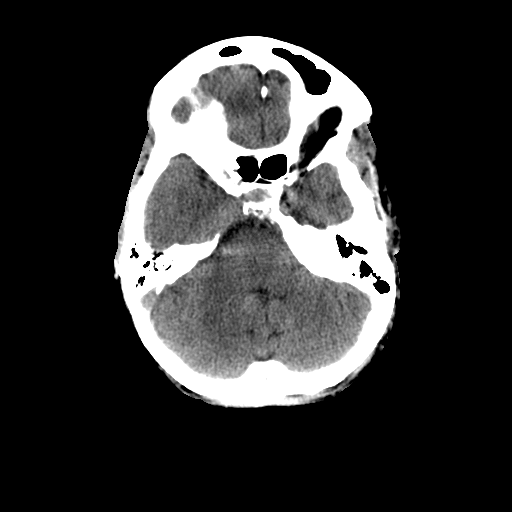
[im 8/28  brain]
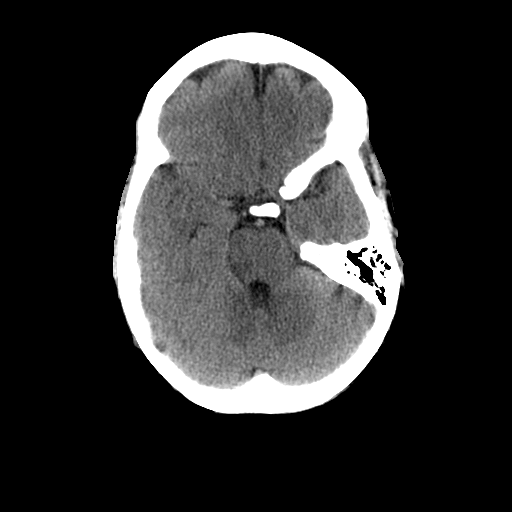
[im 10/28  brain]
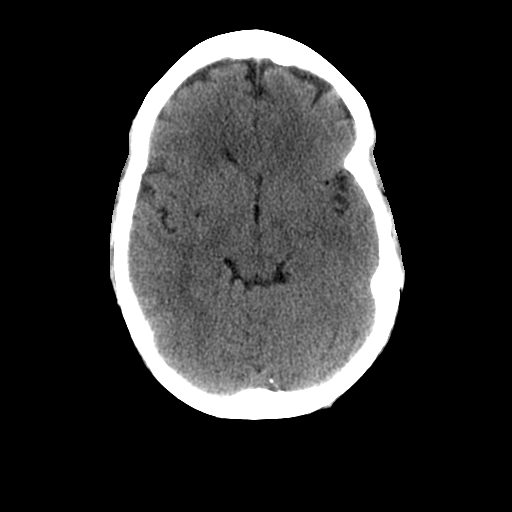
[im 10/28  bone]
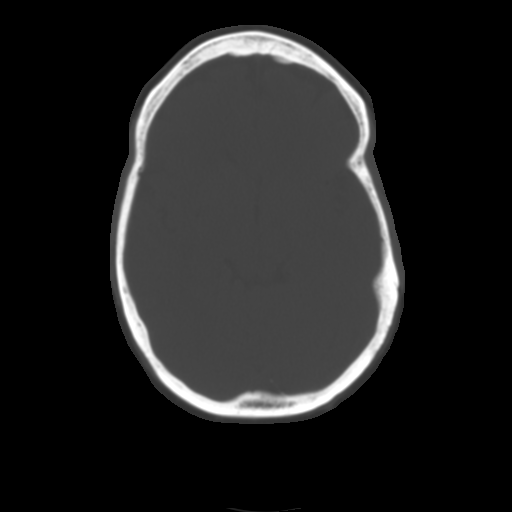
[im 12/28  brain]
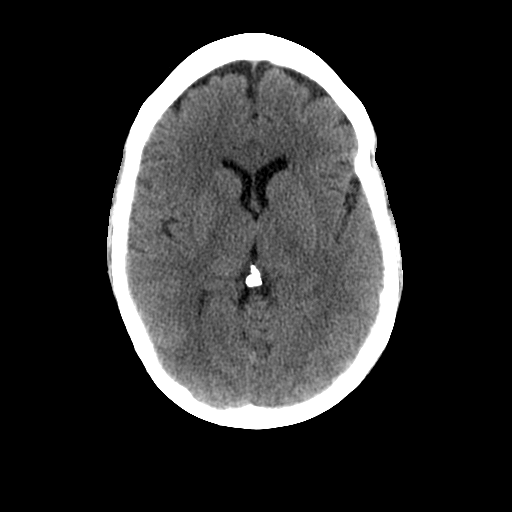
[im 14/28  brain]
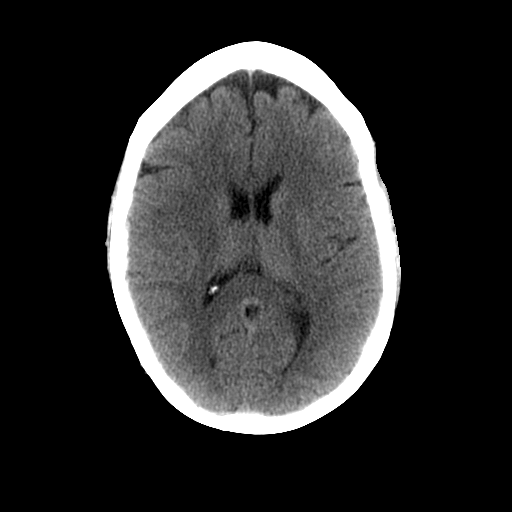
[im 16/28  brain]
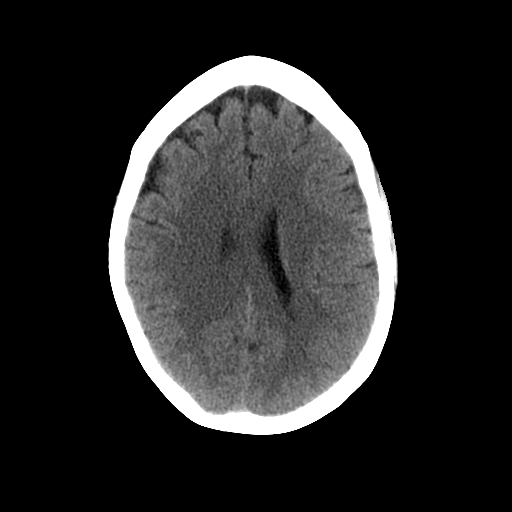
[im 18/28  brain]
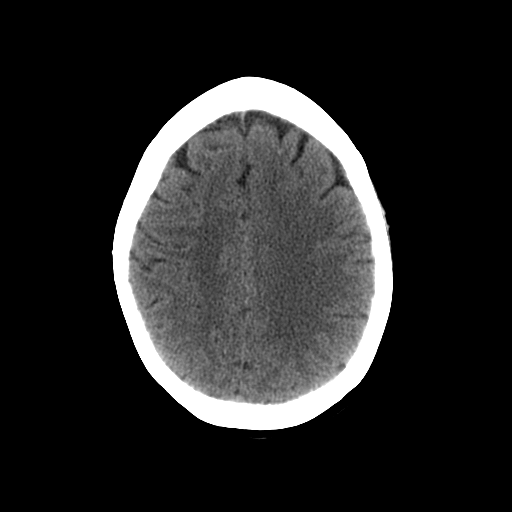
[im 18/28  bone]
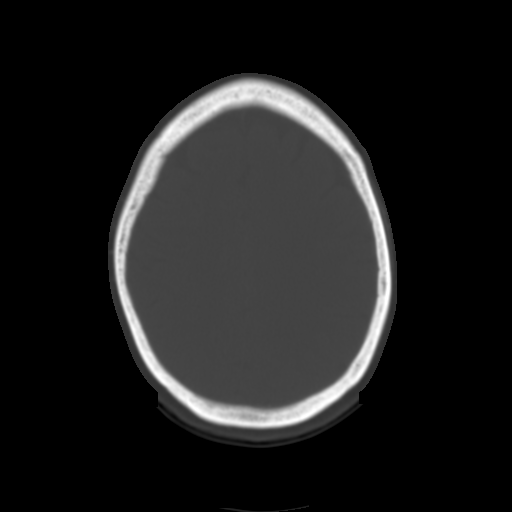
[im 20/28  brain]
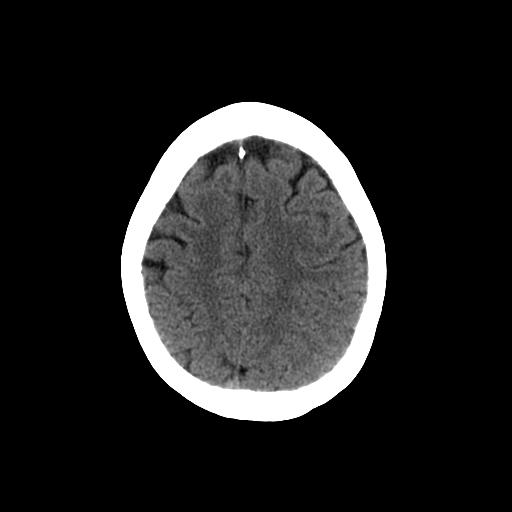
[im 22/28  brain]
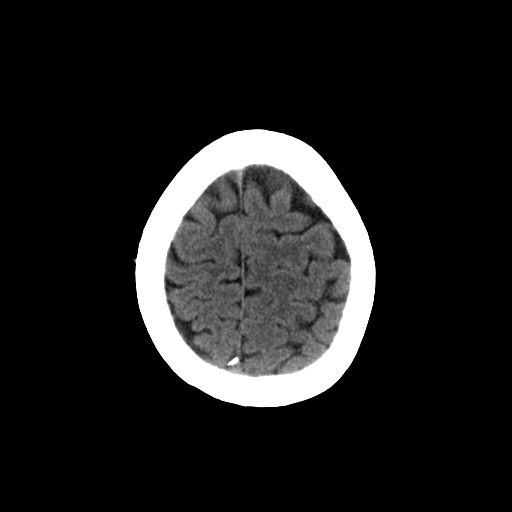
[im 24/28  brain]
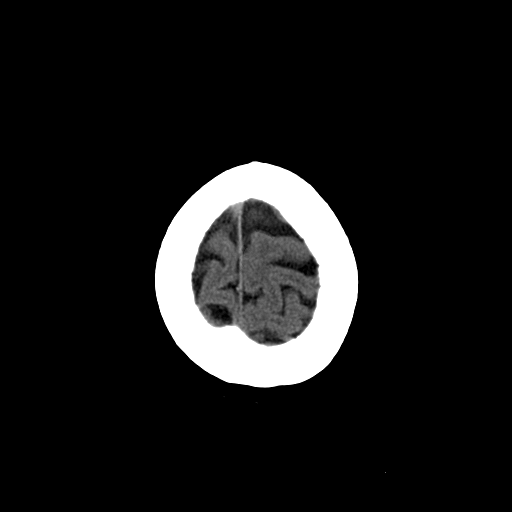
[im 26/28  brain]
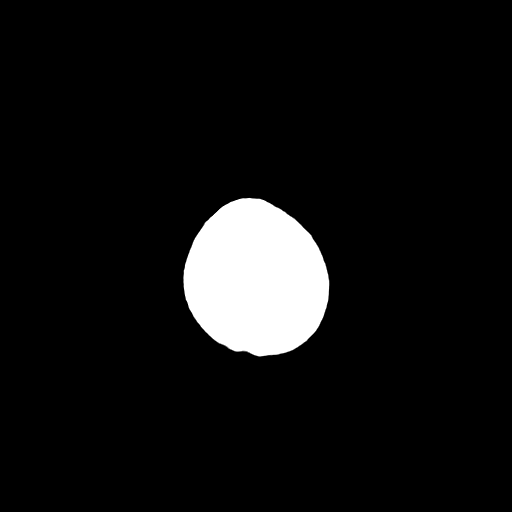
[im 26/28  bone]
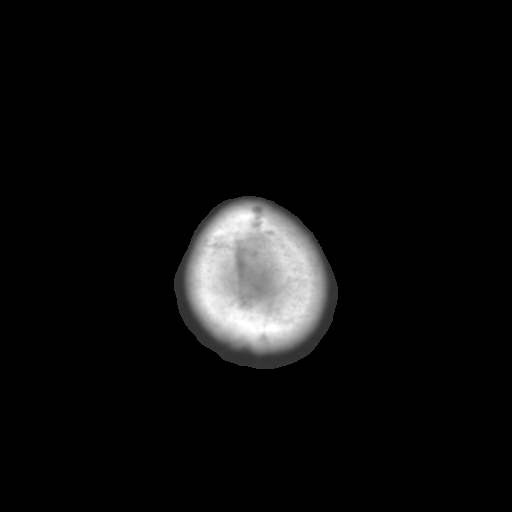

[13 of 30 positions shown; findings below may reference images not displayed]

FINDINGS: There is prominence of the sulci and ventricles.

The brain has an otherwise normal appearance without evidence for
hemorrhage, infarction, hydrocephalus, or mass lesion.  There is no
extra axial fluid collection.  The skull and paranasal sinuses are
normal.
IMPRESSION: 1.  No acute intracranial abnormalities.

## 2012-10-18 IMAGING — CR DG CHEST 2V
2 series · 2 of 2 positions shown · non-contrast
Comparison: Two views of the thoracic spine 02/20/2011 and portable
chest 09/24/2010.

CLINICAL DATA: Paroxysmal atrial fibrillation and dyspnea.

CHEST - 2 VIEW

[w chest lat]
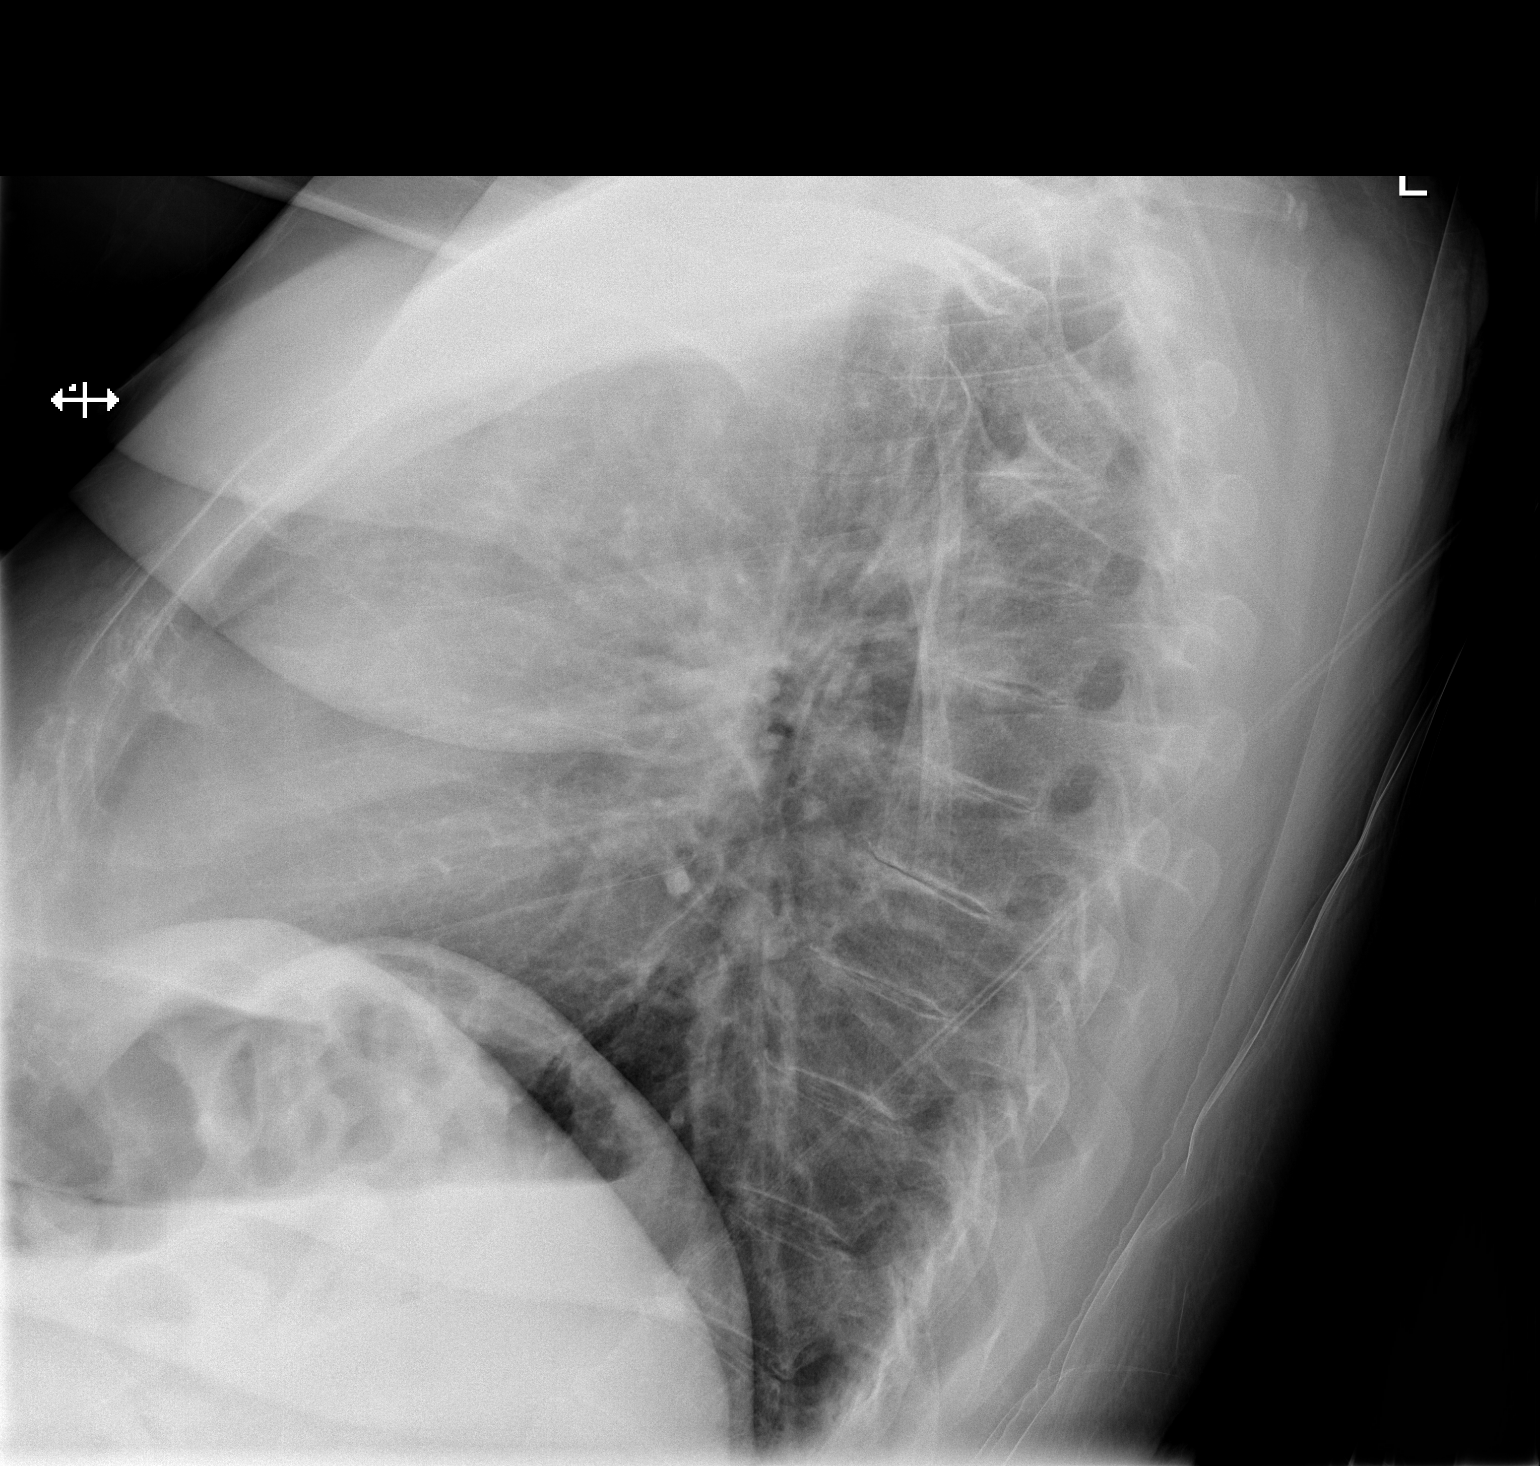

[x chest ap]
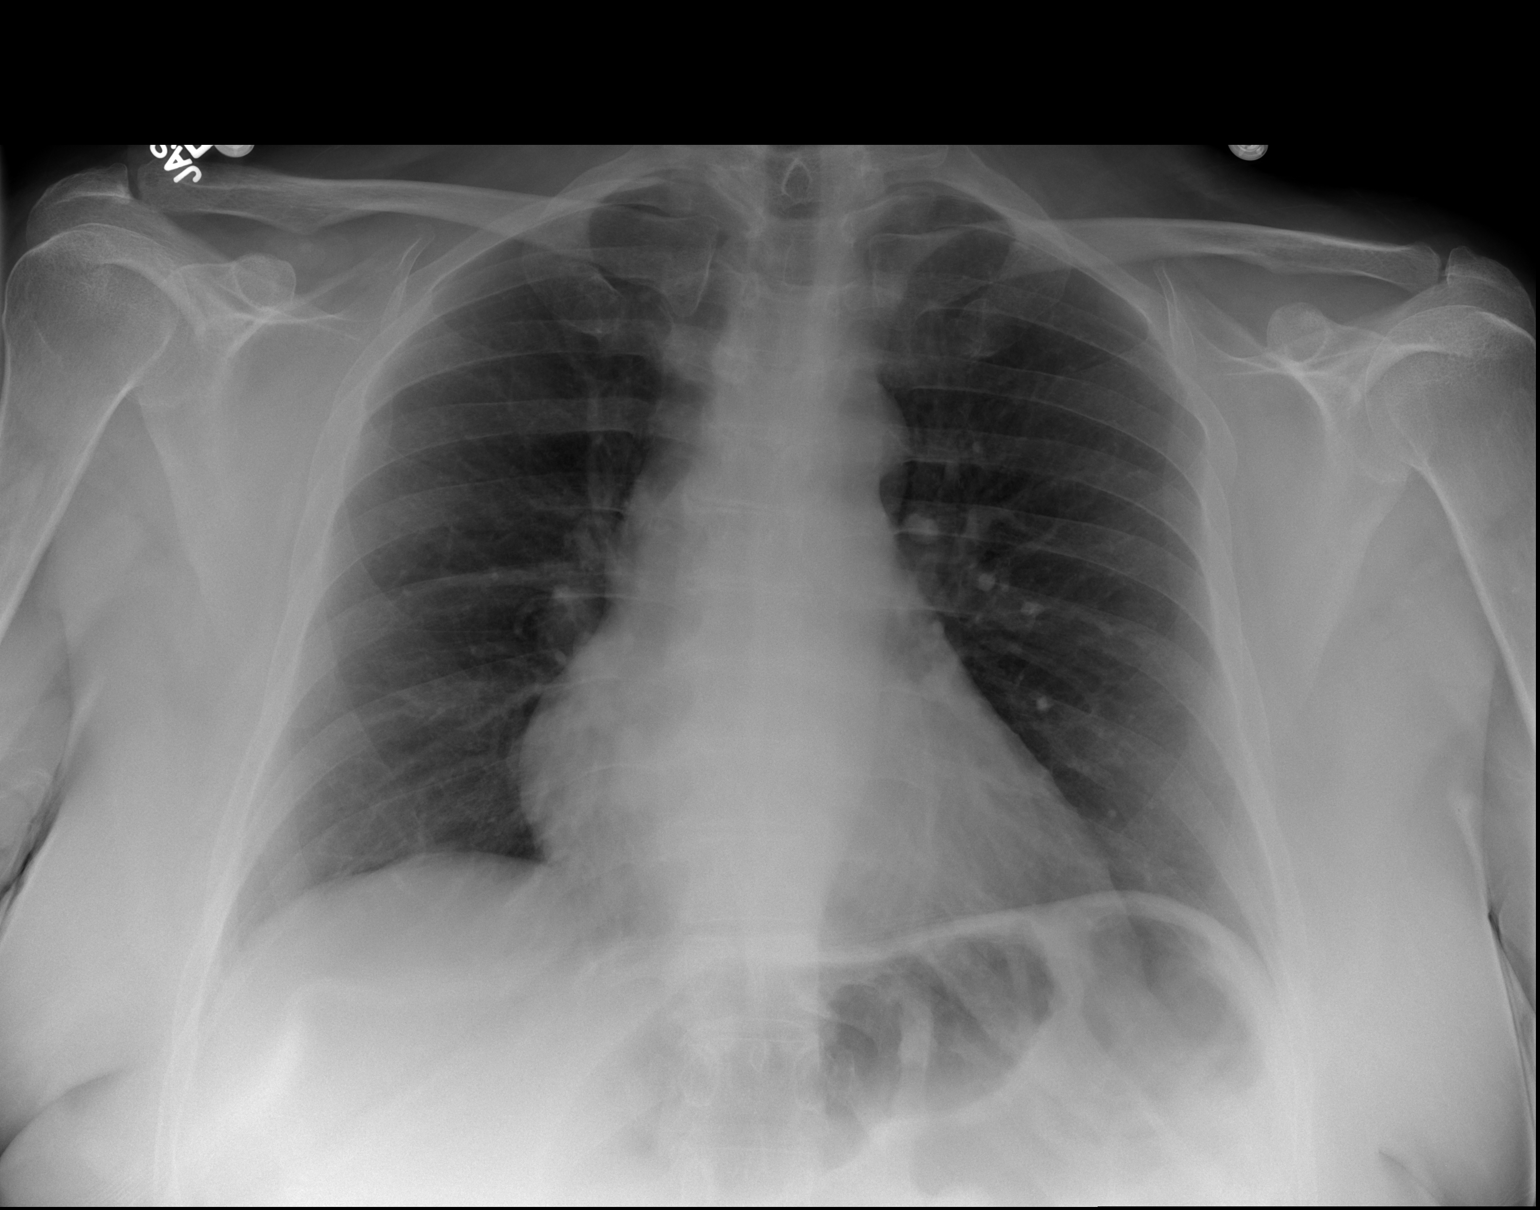

[2 of 2 positions shown; findings below may reference images not displayed]

FINDINGS: Mild cardiac enlargement is stable.  Mild chronic
pulmonary vascular congestion is evident.  There is no edema or
effusion to suggest failure.  The visualized soft tissues and bony
thorax are unremarkable.
IMPRESSION: 1.  Stable mild cardiomegaly and pulmonary vascular congestion.
2.  No evidence for congestive heart failure.

## 2013-11-13 NOTE — Progress Notes (Signed)
HPI: FU atrial fibrillation and syncope. I initially saw in Oct of 2012 for evaluation of palpitations. Note patient had thyroid functions in September of 2012 were normal. Potassium normal. Cardionet revealed runs of afib/flutter. Echo in Oct of 2012 revealed normal LV function and mild LAE. Admitted in 2013 with syncopal episode. Was seen by cardiology, neurology and psychiatry. Etiology of the events unclear. Differential included cardiogenic syncope, psychogenic syncope, pseudoseizures and malingering. Since she was last seen She notes some dyspnea on exertion. There is no orthopnea or PND but she does have mild pedal edema. She has chest pains with exertion.She discontinued her Cardizem previously but developed increasing bouts of atrial fibrillation. She has resumed this and her symptoms have improved.  Current Outpatient Prescriptions  Medication Sig Dispense Refill  . ALPRAZolam (XANAX) 1 MG tablet Take 2 tablets by mouth at bedtime.    [name]. aspirin 81 MG tablet Take 81 mg by mouth daily.    . B Complex-Biotin-FA (B COMPLETE PO) Take 2 tablets by mouth daily.     . diclofenac (VOLTAREN) 75 MG EC tablet Take 1 tablet by mouth daily.    [name]. diltiazem (CARDIZEM CD) 240 MG 24 hr capsule Take 1 capsule by mouth daily.    . divalproex (DEPAKOTE ER) 250 MG 24 hr tablet Take 4 tablets by mouth daily.    [name]. eletriptan (RELPAX) 40 MG tablet One tablet by mouth as needed for migraine headache.  If the headache improves and then returns, dose may be repeated after 2 hours have elapsed since first dose (do not exceed 80 mg per day). may repeat in 2 hours if necessary     . HYDROcodone-acetaminophen (NORCO) 10-325 MG per tablet Take 1 tablet by mouth 3 (three) times daily as needed.    . metoprolol tartrate (LOPRESSOR) 25 MG tablet Take 1 tablet (25 mg total) by mouth at bedtime. 30 tablet 0  . ondansetron (ZOFRAN-ODT) 4 MG disintegrating tablet Take 1 tablet by mouth every 4 (four) hours as needed.    [name].  tiZANidine (ZANAFLEX) 4 MG tablet Take 3 tablets by mouth at bedtime.     No current facility-administered medications for this visit.     Past Medical History  Diagnosis Date  . Depression   . Migraine   . Hyperlipidemia   . Asthma   . IBS (irritable bowel syndrome)   . Fibromyalgia   . Atrial fibrillation     Past Surgical History  Procedure Laterality Date  . Cholecystectomy    . Oophorectomy    . Tonsillectomy    . Appendectomy    . Cesarean section    . Abdominal hysterectomy      History   Social History  . Marital Status: Married    Spouse Name: N/A    Number of Children: 2  . Years of Education: N/A   Occupational History  .      Disabled   Social History Main Topics  . Smoking status: Never Smoker   . Smokeless tobacco: Not on file  . Alcohol Use: Yes     Comment: rarely  . Drug Use: Not on file  . Sexual Activity: Not on file   Other Topics Concern  . Not on file   Social History Narrative    ROS: no fevers or chills, productive cough, hemoptysis, dysphasia, odynophagia, melena, hematochezia, dysuria, hematuria, rash, seizure activity, orthopnea, PND, pedal edema, claudication. Remaining systems are negative.  Physical Exam: Well-developed well-nourished in  no acute distress.  Skin is warm and dry.  HEENT is normal.  Neck is supple.  Chest is clear to auscultation with normal expansion.  Cardiovascular exam is regular rate and rhythm.  Abdominal exam nontender or distended. No masses palpated. Extremities show 1+ ankle edema. neuro grossly intact  ECG Sinus rhythm at a rate of 75. RV conduction delay. Nonspecific ST changes.

## 2013-11-17 ENCOUNTER — Encounter: Payer: Self-pay | Admitting: Cardiology

## 2013-11-17 ENCOUNTER — Ambulatory Visit (INDEPENDENT_AMBULATORY_CARE_PROVIDER_SITE_OTHER): Payer: Medicare Other | Admitting: Cardiology

## 2013-11-17 ENCOUNTER — Encounter: Payer: Self-pay | Admitting: *Deleted

## 2013-11-17 VITALS — BP 118/70 | HR 75 | Ht 67.0 in | Wt 225.0 lb

## 2013-11-17 DIAGNOSIS — R609 Edema, unspecified: Secondary | ICD-10-CM | POA: Insufficient documentation

## 2013-11-17 DIAGNOSIS — R0600 Dyspnea, unspecified: Secondary | ICD-10-CM | POA: Diagnosis not present

## 2013-11-17 DIAGNOSIS — R072 Precordial pain: Secondary | ICD-10-CM

## 2013-11-17 DIAGNOSIS — I4891 Unspecified atrial fibrillation: Secondary | ICD-10-CM | POA: Diagnosis not present

## 2013-11-17 DIAGNOSIS — R002 Palpitations: Secondary | ICD-10-CM

## 2013-11-17 DIAGNOSIS — I480 Paroxysmal atrial fibrillation: Secondary | ICD-10-CM

## 2013-11-17 DIAGNOSIS — R079 Chest pain, unspecified: Secondary | ICD-10-CM | POA: Insufficient documentation

## 2013-11-17 NOTE — Assessment & Plan Note (Signed)
Schedule stress echocardiogram to further evaluate.

## 2013-11-17 NOTE — Assessment & Plan Note (Signed)
Echocardiogram to assess LV systolic and diastolic function.

## 2013-11-17 NOTE — Patient Instructions (Signed)
Your physician wants you to follow-up in: 6 MONTHS WITH DR Jens SomRENSHAW You will receive a reminder letter in the mail two months in advance. If you don't receive a letter, please call our office to schedule the follow-up appointment.   Your physician has recommended that you wear an event monitor. Event monitors are medical devices that record the heart's electrical activity. Doctors most often us these monitors to diagnose arrhythmias. Arrhythmias are problems with the speed or rhythm of the heartbeat. The monitor is a small, portable device. You can wear one while you do your normal daily activities. This is usually used to diagnose what is causing palpitations/syncope (passing out).   Your physician has requested that you have a stress echocardiogram. For further information please visit [email or web address]. Please follow instruction sheet as given.   Your physician has requested that you have an echocardiogram. Echocardiography is a painless test that uses sound waves to create images of your heart. It provides your doctor with information about the size and shape of your heart and how well your heart's chambers and valves are working. This procedure takes approximately one hour. There are no restrictions for this procedure.   PLEASE SCHEDULE ALL TESTING AND EWVENT AT THE [address] LOCATION

## 2013-11-17 NOTE — Assessment & Plan Note (Addendum)
Patient is in sinus rhythm today.Continue Cardizem for rate control. Continue aspirin. Her only embolic risk factor is female sex. She is having palpitations. Schedule monitor to further assess.

## 2013-11-17 NOTE — Assessment & Plan Note (Signed)
Schedule monitor to further assess.

## 2013-11-18 ENCOUNTER — Ambulatory Visit (INDEPENDENT_AMBULATORY_CARE_PROVIDER_SITE_OTHER): Payer: Medicare Other | Admitting: Neurology

## 2013-11-18 ENCOUNTER — Telehealth: Payer: Self-pay | Admitting: Neurology

## 2013-11-18 ENCOUNTER — Encounter: Payer: Self-pay | Admitting: Neurology

## 2013-11-18 VITALS — BP 110/78 | HR 66 | Resp 16 | Ht 66.0 in | Wt 224.4 lb

## 2013-11-18 DIAGNOSIS — R519 Headache, unspecified: Secondary | ICD-10-CM

## 2013-11-18 DIAGNOSIS — R42 Dizziness and giddiness: Secondary | ICD-10-CM

## 2013-11-18 DIAGNOSIS — G629 Polyneuropathy, unspecified: Secondary | ICD-10-CM

## 2013-11-18 DIAGNOSIS — R51 Headache: Secondary | ICD-10-CM

## 2013-11-18 DIAGNOSIS — M542 Cervicalgia: Secondary | ICD-10-CM

## 2013-11-18 NOTE — Progress Notes (Signed)
Done

## 2013-11-18 NOTE — Patient Instructions (Signed)
1. Start physical therapy for vestibular therapy and neck pain 2. We will obtain bloodwork from your PCP in [place], as well as records from [facility] and [facility] 3. Continue all your current medications 4. Follow-up in 6-8 weeks
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
## 2013-11-18 NOTE — Telephone Encounter (Signed)
Spoke with patient to give PT appt information. Patient was scheduled with [facility] in [place] on Thursday 11/20/13 @ 8:00 am. Records were faxed.  Location: [address] Ph: [phone] Fax: [phone]

## 2013-11-18 NOTE — Telephone Encounter (Signed)
Pt called/returning your call at 12:41PM. Please call pt # [phone]

## 2013-11-20 ENCOUNTER — Encounter: Payer: Self-pay | Admitting: *Deleted

## 2013-11-20 ENCOUNTER — Ambulatory Visit (HOSPITAL_BASED_OUTPATIENT_CLINIC_OR_DEPARTMENT_OTHER): Payer: Medicare Other

## 2013-11-20 ENCOUNTER — Encounter (INDEPENDENT_AMBULATORY_CARE_PROVIDER_SITE_OTHER): Payer: Medicare Other

## 2013-11-20 ENCOUNTER — Ambulatory Visit (HOSPITAL_COMMUNITY): Payer: Medicare Other | Attending: Internal Medicine

## 2013-11-20 DIAGNOSIS — I480 Paroxysmal atrial fibrillation: Secondary | ICD-10-CM

## 2013-11-20 DIAGNOSIS — R0600 Dyspnea, unspecified: Secondary | ICD-10-CM

## 2013-11-20 DIAGNOSIS — I4891 Unspecified atrial fibrillation: Secondary | ICD-10-CM | POA: Diagnosis not present

## 2013-11-20 DIAGNOSIS — R002 Palpitations: Secondary | ICD-10-CM

## 2013-11-20 NOTE — Progress Notes (Signed)
2D Echo completed. 11/20/2013 

## 2013-11-20 NOTE — Progress Notes (Signed)
Stress echo completed 11/20/2013

## 2013-11-20 NOTE — Progress Notes (Signed)
Patient ID: Connie Jackson, female   DOB: November 08, 1955, 58 y.o.   MRN: [id number] [employer] 30 day cardiac event monitor applied to patient.

## 2013-11-24 ENCOUNTER — Telehealth: Payer: Self-pay | Admitting: Family Medicine

## 2013-11-24 DIAGNOSIS — R292 Abnormal reflex: Secondary | ICD-10-CM

## 2013-11-24 MED ORDER — DIAZEPAM 5 MG PO TABS: ORAL_TABLET | ORAL | Status: DC

## 2013-11-24 NOTE — Telephone Encounter (Signed)
-----  Message from Cameron Sprang, MD sent at 11/22/2013 10:24 AM EST ----- Regarding: additional studies Pls let her know I reviewed records from [facility] and [facility]. No cervical imaging done, pls order MRI C-spine without contrast for hyperreflexia and left numbness. Her labs showed low B12 level, pls send labs for repeat B12, TSH, SPEP/IFE, ESR. These are for neuropathy.   Last thing, according to [facility] notes, she had passed out. Pls remind her of [place] driving laws that for any episode of passing out for any reason, she should not drive until 6 months event-free.   Thanks, Santiago Glad

## 2013-11-24 NOTE — Telephone Encounter (Signed)
Called patient to talk with her about Dr. Karel JarvisAquino wanting her to have some labs done & an MRI of her Cervical Spine. Patient is agreeable to tests. I will put in order for labs and patient will come and pick up lab order to [facility] to [facility]. Will also schedule MRI and call patient with appt information. Patient is claustrophobic & requested Rx for Valium for scan. Rx ok 'd by Dr. Everlena CooperJaffe. Patient was informed about [place] driving laws.   Patient has been scheduled @ [facility] for MRI Cervical Spine on 12/02/13 @ 5:00pm for a 4:45pm arrival. Patient has been notified.

## 2013-11-30 ENCOUNTER — Other Ambulatory Visit: Payer: Medicare Other

## 2013-12-02 ENCOUNTER — Ambulatory Visit (HOSPITAL_COMMUNITY)
Admission: RE | Admit: 2013-12-02 | Discharge: 2013-12-02 | Disposition: A | Discharge status: Home / Self Care | Payer: Medicare Other | Source: Ambulatory Visit | Attending: Neurology | Admitting: Neurology

## 2013-12-02 DIAGNOSIS — R202 Paresthesia of skin: Secondary | ICD-10-CM | POA: Insufficient documentation

## 2013-12-02 DIAGNOSIS — R292 Abnormal reflex: Secondary | ICD-10-CM | POA: Insufficient documentation

## 2013-12-02 DIAGNOSIS — M4802 Spinal stenosis, cervical region: Secondary | ICD-10-CM | POA: Insufficient documentation

## 2013-12-02 DIAGNOSIS — M5032 Other cervical disc degeneration, mid-cervical region: Secondary | ICD-10-CM | POA: Diagnosis not present

## 2013-12-03 LAB — SEDIMENTATION RATE: SED RATE: 12 mm/h (ref 0–22)

## 2013-12-03 LAB — VITAMIN B12: VITAMIN B 12: 366 pg/mL (ref 211–911)

## 2013-12-03 LAB — TSH: TSH: 2.574 u[IU]/mL (ref 0.350–4.500)

## 2013-12-04 LAB — PROTEIN ELECTROPHORESIS, SERUM
Albumin ELP: 58.8 % (ref 55.8–66.1)
Alpha-1-Globulin: 4.3 % (ref 2.9–4.9)
Alpha-2-Globulin: 10.6 % (ref 7.1–11.8)
BETA 2: 5.5 % (ref 3.2–6.5)
BETA GLOBULIN: 6.2 % (ref 4.7–7.2)
GAMMA GLOBULIN: 14.6 % (ref 11.1–18.8)
Total Protein, Serum Electrophoresis: 7.8 g/dL (ref 6.0–8.3)

## 2013-12-04 LAB — IMMUNOFIXATION ELECTROPHORESIS
IGA: 197 mg/dL (ref 69–380)
IgG (Immunoglobin G), Serum: 1080 mg/dL (ref 690–1700)
IgM, Serum: 120 mg/dL (ref 52–322)
Total Protein, Serum Electrophoresis: 7.8 g/dL (ref 6.0–8.3)

## 2013-12-05 ENCOUNTER — Telehealth: Payer: Self-pay | Admitting: Neurology

## 2013-12-05 NOTE — Telephone Encounter (Signed)
Pt called f/u on the results of her MRI she had done on Tuesday 12/02/13. C/B [phone]

## 2013-12-05 NOTE — Telephone Encounter (Signed)
Patient call with results. See result notes.

## 2013-12-09 ENCOUNTER — Telehealth: Payer: Self-pay | Admitting: Neurology

## 2013-12-09 NOTE — Telephone Encounter (Signed)
Pt requested sooner appt with Dr. Karel JarvisAquino, wants work in if possible. Says she is much better and wants to see Dr. Karel JarvisAquino ASAP to get driver restrictions updated. She is currently not driving and it is really causing a hardship among her family. Please call at [phone] / Sherri S.

## 2013-12-09 NOTE — Telephone Encounter (Signed)
Please review

## 2013-12-09 NOTE — Telephone Encounter (Signed)
Pls let her know that driving restrictions are based on the episode of passing out, regardless of test results or current exam. According to ER note we received, she was unresponsive at one point, and per DMV, for any episode of loss of awareness or consciousness for any reason (heart, brain, sugar levels), one should not drive until 6 months event-free.

## 2013-12-09 NOTE — Telephone Encounter (Signed)
Patient was notified of advisement. 

## 2013-12-26 ENCOUNTER — Telehealth: Payer: Self-pay | Admitting: *Deleted

## 2013-12-26 NOTE — Telephone Encounter (Signed)
Monitor reviewed by dr Jens Somcrenshaw shows sinus to sinus brady with PAC's  pt aware of results

## 2014-01-02 ENCOUNTER — Ambulatory Visit (INDEPENDENT_AMBULATORY_CARE_PROVIDER_SITE_OTHER): Payer: Medicare Other | Admitting: Neurology

## 2014-01-02 VITALS — BP 120/80 | HR 68 | Resp 18 | Ht 66.0 in | Wt 225.0 lb

## 2014-01-02 DIAGNOSIS — G43009 Migraine without aura, not intractable, without status migrainosus: Secondary | ICD-10-CM

## 2014-01-02 DIAGNOSIS — M5010 Cervical disc disorder with radiculopathy, unspecified cervical region: Secondary | ICD-10-CM
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
## 2014-01-02 NOTE — Patient Instructions (Signed)
1. Continue all your medications 2. Continue physical therapy home exercises 3. As per [place] and [place] driving laws, after an episode of loss of consciousness, one should not drive until 6 months event-free 4. Follow-up in 6 months

## 2014-01-07 ENCOUNTER — Encounter: Payer: Self-pay | Admitting: Neurology

## 2014-01-07 NOTE — Progress Notes (Signed)
Done

## 2014-02-02 ENCOUNTER — Ambulatory Visit: Payer: Medicare Other

## 2014-02-24 DIAGNOSIS — I4891 Unspecified atrial fibrillation: Secondary | ICD-10-CM | POA: Diagnosis not present

## 2014-02-24 DIAGNOSIS — G894 Chronic pain syndrome: Secondary | ICD-10-CM | POA: Diagnosis not present

## 2014-02-24 DIAGNOSIS — M797 Fibromyalgia: Secondary | ICD-10-CM | POA: Diagnosis not present

## 2014-02-24 DIAGNOSIS — F329 Major depressive disorder, single episode, unspecified: Secondary | ICD-10-CM | POA: Diagnosis not present

## 2014-02-24 DIAGNOSIS — F419 Anxiety disorder, unspecified: Secondary | ICD-10-CM | POA: Diagnosis not present

## 2014-03-06 DIAGNOSIS — I10 Essential (primary) hypertension: Secondary | ICD-10-CM | POA: Diagnosis not present

## 2014-03-06 DIAGNOSIS — G43909 Migraine, unspecified, not intractable, without status migrainosus: Secondary | ICD-10-CM | POA: Diagnosis not present

## 2014-03-06 DIAGNOSIS — M797 Fibromyalgia: Secondary | ICD-10-CM | POA: Diagnosis not present

## 2014-03-24 DIAGNOSIS — R21 Rash and other nonspecific skin eruption: Secondary | ICD-10-CM | POA: Diagnosis not present

## 2014-03-24 DIAGNOSIS — Z6835 Body mass index (BMI) 35.0-35.9, adult: Secondary | ICD-10-CM | POA: Diagnosis not present

## 2014-03-24 DIAGNOSIS — F329 Major depressive disorder, single episode, unspecified: Secondary | ICD-10-CM | POA: Diagnosis not present

## 2014-04-03 ENCOUNTER — Ambulatory Visit: Payer: Medicare Other | Admitting: Neurology

## 2014-04-09 DIAGNOSIS — F329 Major depressive disorder, single episode, unspecified: Secondary | ICD-10-CM | POA: Diagnosis not present

## 2014-04-09 DIAGNOSIS — J020 Streptococcal pharyngitis: Secondary | ICD-10-CM | POA: Diagnosis not present

## 2014-04-09 DIAGNOSIS — Z6835 Body mass index (BMI) 35.0-35.9, adult: Secondary | ICD-10-CM | POA: Diagnosis not present

## 2014-04-09 DIAGNOSIS — J302 Other seasonal allergic rhinitis: Secondary | ICD-10-CM | POA: Diagnosis not present

## 2014-06-08 DIAGNOSIS — M47894 Other spondylosis, thoracic region: Secondary | ICD-10-CM | POA: Diagnosis not present

## 2014-06-08 DIAGNOSIS — M47812 Spondylosis without myelopathy or radiculopathy, cervical region: Secondary | ICD-10-CM | POA: Diagnosis not present

## 2014-06-08 DIAGNOSIS — Z6836 Body mass index (BMI) 36.0-36.9, adult: Secondary | ICD-10-CM | POA: Diagnosis not present

## 2014-06-08 DIAGNOSIS — M47816 Spondylosis without myelopathy or radiculopathy, lumbar region: Secondary | ICD-10-CM | POA: Diagnosis not present

## 2014-06-08 DIAGNOSIS — M546 Pain in thoracic spine: Secondary | ICD-10-CM | POA: Diagnosis not present

## 2014-06-08 DIAGNOSIS — M47814 Spondylosis without myelopathy or radiculopathy, thoracic region: Secondary | ICD-10-CM | POA: Diagnosis not present

## 2014-06-08 DIAGNOSIS — M5032 Other cervical disc degeneration, mid-cervical region: Secondary | ICD-10-CM | POA: Diagnosis not present

## 2014-06-08 DIAGNOSIS — M5136 Other intervertebral disc degeneration, lumbar region: Secondary | ICD-10-CM | POA: Diagnosis not present

## 2014-06-08 DIAGNOSIS — M542 Cervicalgia: Secondary | ICD-10-CM | POA: Diagnosis not present

## 2014-06-08 DIAGNOSIS — M4184 Other forms of scoliosis, thoracic region: Secondary | ICD-10-CM | POA: Diagnosis not present

## 2014-06-08 DIAGNOSIS — M545 Low back pain: Secondary | ICD-10-CM | POA: Diagnosis not present

## 2014-06-08 DIAGNOSIS — G894 Chronic pain syndrome: Secondary | ICD-10-CM | POA: Diagnosis not present

## 2014-07-06 ENCOUNTER — Ambulatory Visit: Payer: Medicare Other | Admitting: Neurology

## 2014-07-06 DIAGNOSIS — E785 Hyperlipidemia, unspecified: Secondary | ICD-10-CM | POA: Diagnosis not present

## 2014-07-06 DIAGNOSIS — G609 Hereditary and idiopathic neuropathy, unspecified: Secondary | ICD-10-CM | POA: Diagnosis not present

## 2014-07-06 DIAGNOSIS — I10 Essential (primary) hypertension: Secondary | ICD-10-CM | POA: Diagnosis not present

## 2014-07-06 DIAGNOSIS — I4891 Unspecified atrial fibrillation: Secondary | ICD-10-CM | POA: Diagnosis not present

## 2014-07-06 DIAGNOSIS — G43909 Migraine, unspecified, not intractable, without status migrainosus: Secondary | ICD-10-CM | POA: Diagnosis not present

## 2014-08-03 DIAGNOSIS — F329 Major depressive disorder, single episode, unspecified: Secondary | ICD-10-CM | POA: Diagnosis not present

## 2014-08-03 DIAGNOSIS — M797 Fibromyalgia: Secondary | ICD-10-CM | POA: Diagnosis not present

## 2014-08-07 DIAGNOSIS — R45851 Suicidal ideations: Secondary | ICD-10-CM | POA: Diagnosis not present

## 2014-08-07 DIAGNOSIS — F315 Bipolar disorder, current episode depressed, severe, with psychotic features: Secondary | ICD-10-CM | POA: Diagnosis not present

## 2014-08-07 DIAGNOSIS — F319 Bipolar disorder, unspecified: Secondary | ICD-10-CM | POA: Diagnosis not present

## 2014-09-01 DIAGNOSIS — F329 Major depressive disorder, single episode, unspecified: Secondary | ICD-10-CM | POA: Diagnosis not present

## 2014-09-29 DIAGNOSIS — M797 Fibromyalgia: Secondary | ICD-10-CM | POA: Diagnosis not present

## 2014-09-29 DIAGNOSIS — Z6836 Body mass index (BMI) 36.0-36.9, adult: Secondary | ICD-10-CM | POA: Diagnosis not present

## 2014-09-29 DIAGNOSIS — I4892 Unspecified atrial flutter: Secondary | ICD-10-CM | POA: Diagnosis not present

## 2014-09-29 DIAGNOSIS — F3130 Bipolar disorder, current episode depressed, mild or moderate severity, unspecified: Secondary | ICD-10-CM | POA: Diagnosis not present

## 2014-09-29 DIAGNOSIS — G40909 Epilepsy, unspecified, not intractable, without status epilepticus: Secondary | ICD-10-CM | POA: Diagnosis not present

## 2014-10-28 DIAGNOSIS — M797 Fibromyalgia: Secondary | ICD-10-CM | POA: Diagnosis not present

## 2014-10-28 DIAGNOSIS — G609 Hereditary and idiopathic neuropathy, unspecified: Secondary | ICD-10-CM | POA: Diagnosis not present

## 2014-12-22 DIAGNOSIS — E669 Obesity, unspecified: Secondary | ICD-10-CM | POA: Diagnosis not present

## 2014-12-22 DIAGNOSIS — Z79899 Other long term (current) drug therapy: Secondary | ICD-10-CM | POA: Diagnosis not present

## 2014-12-22 DIAGNOSIS — I4892 Unspecified atrial flutter: Secondary | ICD-10-CM | POA: Diagnosis not present

## 2014-12-22 DIAGNOSIS — G40909 Epilepsy, unspecified, not intractable, without status epilepticus: Secondary | ICD-10-CM | POA: Diagnosis not present

## 2014-12-22 DIAGNOSIS — Z136 Encounter for screening for cardiovascular disorders: Secondary | ICD-10-CM | POA: Diagnosis not present

## 2014-12-22 DIAGNOSIS — Z1389 Encounter for screening for other disorder: Secondary | ICD-10-CM | POA: Diagnosis not present

## 2014-12-22 DIAGNOSIS — M797 Fibromyalgia: Secondary | ICD-10-CM | POA: Diagnosis not present

## 2014-12-22 DIAGNOSIS — F3130 Bipolar disorder, current episode depressed, mild or moderate severity, unspecified: Secondary | ICD-10-CM | POA: Diagnosis not present

## 2015-04-05 DIAGNOSIS — F3130 Bipolar disorder, current episode depressed, mild or moderate severity, unspecified: Secondary | ICD-10-CM | POA: Diagnosis not present

## 2015-04-05 DIAGNOSIS — G40909 Epilepsy, unspecified, not intractable, without status epilepticus: Secondary | ICD-10-CM | POA: Diagnosis not present

## 2015-04-05 DIAGNOSIS — M797 Fibromyalgia: Secondary | ICD-10-CM | POA: Diagnosis not present

## 2015-04-05 DIAGNOSIS — I4892 Unspecified atrial flutter: Secondary | ICD-10-CM | POA: Diagnosis not present

## 2015-04-05 DIAGNOSIS — N390 Urinary tract infection, site not specified: Secondary | ICD-10-CM | POA: Diagnosis not present

## 2015-06-04 DIAGNOSIS — R21 Rash and other nonspecific skin eruption: Secondary | ICD-10-CM | POA: Diagnosis not present

## 2015-07-08 DIAGNOSIS — L300 Nummular dermatitis: Secondary | ICD-10-CM | POA: Diagnosis not present

## 2015-07-08 DIAGNOSIS — L299 Pruritus, unspecified: Secondary | ICD-10-CM | POA: Diagnosis not present

## 2015-07-09 DIAGNOSIS — Z1389 Encounter for screening for other disorder: Secondary | ICD-10-CM | POA: Diagnosis not present

## 2015-07-09 DIAGNOSIS — I4892 Unspecified atrial flutter: Secondary | ICD-10-CM | POA: Diagnosis not present

## 2015-07-09 DIAGNOSIS — H8113 Benign paroxysmal vertigo, bilateral: Secondary | ICD-10-CM | POA: Diagnosis not present

## 2015-07-09 DIAGNOSIS — E669 Obesity, unspecified: Secondary | ICD-10-CM | POA: Diagnosis not present

## 2015-07-09 DIAGNOSIS — E785 Hyperlipidemia, unspecified: Secondary | ICD-10-CM | POA: Diagnosis not present

## 2016-01-11 DIAGNOSIS — E785 Hyperlipidemia, unspecified: Secondary | ICD-10-CM | POA: Diagnosis not present

## 2016-01-11 DIAGNOSIS — I4892 Unspecified atrial flutter: Secondary | ICD-10-CM | POA: Diagnosis not present

## 2016-02-29 DIAGNOSIS — M5090 Cervical disc disorder, unspecified, unspecified cervical region: Secondary | ICD-10-CM | POA: Diagnosis not present

## 2016-02-29 DIAGNOSIS — M4724 Other spondylosis with radiculopathy, thoracic region: Secondary | ICD-10-CM | POA: Diagnosis not present

## 2016-02-29 DIAGNOSIS — G894 Chronic pain syndrome: Secondary | ICD-10-CM | POA: Diagnosis not present

## 2016-02-29 DIAGNOSIS — M5137 Other intervertebral disc degeneration, lumbosacral region: Secondary | ICD-10-CM | POA: Diagnosis not present

## 2016-04-25 DIAGNOSIS — M542 Cervicalgia: Secondary | ICD-10-CM | POA: Diagnosis not present

## 2016-04-25 DIAGNOSIS — G894 Chronic pain syndrome: Secondary | ICD-10-CM | POA: Diagnosis not present

## 2016-04-25 DIAGNOSIS — M5134 Other intervertebral disc degeneration, thoracic region: Secondary | ICD-10-CM | POA: Diagnosis not present

## 2016-04-25 DIAGNOSIS — M5090 Cervical disc disorder, unspecified, unspecified cervical region: Secondary | ICD-10-CM | POA: Diagnosis not present

## 2016-05-22 DIAGNOSIS — M542 Cervicalgia: Secondary | ICD-10-CM | POA: Diagnosis not present

## 2016-05-22 DIAGNOSIS — M5134 Other intervertebral disc degeneration, thoracic region: Secondary | ICD-10-CM | POA: Diagnosis not present

## 2016-05-22 DIAGNOSIS — M5136 Other intervertebral disc degeneration, lumbar region: Secondary | ICD-10-CM | POA: Diagnosis not present

## 2016-05-22 DIAGNOSIS — M5090 Cervical disc disorder, unspecified, unspecified cervical region: Secondary | ICD-10-CM | POA: Diagnosis not present

## 2016-08-04 DIAGNOSIS — M797 Fibromyalgia: Secondary | ICD-10-CM | POA: Diagnosis not present

## 2016-08-04 DIAGNOSIS — Z1389 Encounter for screening for other disorder: Secondary | ICD-10-CM | POA: Diagnosis not present

## 2016-08-04 DIAGNOSIS — I4892 Unspecified atrial flutter: Secondary | ICD-10-CM | POA: Diagnosis not present

## 2016-08-04 DIAGNOSIS — E785 Hyperlipidemia, unspecified: Secondary | ICD-10-CM | POA: Diagnosis not present

## 2016-12-24 DIAGNOSIS — R069 Unspecified abnormalities of breathing: Secondary | ICD-10-CM | POA: Diagnosis not present

## 2016-12-24 DIAGNOSIS — J189 Pneumonia, unspecified organism: Secondary | ICD-10-CM | POA: Diagnosis not present

## 2016-12-24 DIAGNOSIS — J9601 Acute respiratory failure with hypoxia: Secondary | ICD-10-CM | POA: Diagnosis not present

## 2016-12-24 DIAGNOSIS — R51 Headache: Secondary | ICD-10-CM | POA: Diagnosis not present

## 2016-12-24 DIAGNOSIS — I10 Essential (primary) hypertension: Secondary | ICD-10-CM | POA: Diagnosis not present

## 2016-12-24 DIAGNOSIS — R7989 Other specified abnormal findings of blood chemistry: Secondary | ICD-10-CM | POA: Diagnosis not present

## 2016-12-24 DIAGNOSIS — R05 Cough: Secondary | ICD-10-CM | POA: Diagnosis not present

## 2016-12-24 DIAGNOSIS — R0602 Shortness of breath: Secondary | ICD-10-CM | POA: Diagnosis not present

## 2016-12-24 DIAGNOSIS — R0902 Hypoxemia: Secondary | ICD-10-CM | POA: Diagnosis not present

## 2016-12-25 DIAGNOSIS — Z7982 Long term (current) use of aspirin: Secondary | ICD-10-CM | POA: Diagnosis not present

## 2016-12-25 DIAGNOSIS — R51 Headache: Secondary | ICD-10-CM | POA: Diagnosis not present

## 2016-12-25 DIAGNOSIS — J189 Pneumonia, unspecified organism: Secondary | ICD-10-CM | POA: Diagnosis not present

## 2016-12-25 DIAGNOSIS — M797 Fibromyalgia: Secondary | ICD-10-CM | POA: Diagnosis not present

## 2016-12-25 DIAGNOSIS — R0902 Hypoxemia: Secondary | ICD-10-CM | POA: Diagnosis not present

## 2016-12-25 DIAGNOSIS — I4891 Unspecified atrial fibrillation: Secondary | ICD-10-CM | POA: Diagnosis not present

## 2016-12-25 DIAGNOSIS — J9601 Acute respiratory failure with hypoxia: Secondary | ICD-10-CM | POA: Diagnosis not present

## 2016-12-25 DIAGNOSIS — Z79899 Other long term (current) drug therapy: Secondary | ICD-10-CM | POA: Diagnosis not present

## 2016-12-25 DIAGNOSIS — R05 Cough: Secondary | ICD-10-CM | POA: Diagnosis not present

## 2016-12-25 DIAGNOSIS — J45901 Unspecified asthma with (acute) exacerbation: Secondary | ICD-10-CM | POA: Diagnosis not present

## 2016-12-25 DIAGNOSIS — R0602 Shortness of breath: Secondary | ICD-10-CM | POA: Diagnosis not present

## 2016-12-25 DIAGNOSIS — R7989 Other specified abnormal findings of blood chemistry: Secondary | ICD-10-CM | POA: Diagnosis not present

## 2016-12-25 DIAGNOSIS — I10 Essential (primary) hypertension: Secondary | ICD-10-CM | POA: Diagnosis not present

## 2017-03-02 DIAGNOSIS — E785 Hyperlipidemia, unspecified: Secondary | ICD-10-CM | POA: Diagnosis not present

## 2017-03-02 DIAGNOSIS — M797 Fibromyalgia: Secondary | ICD-10-CM | POA: Diagnosis not present

## 2017-03-02 DIAGNOSIS — I4892 Unspecified atrial flutter: Secondary | ICD-10-CM | POA: Diagnosis not present

## 2017-03-05 DIAGNOSIS — L7211 Pilar cyst: Secondary | ICD-10-CM | POA: Diagnosis not present

## 2017-03-05 DIAGNOSIS — L300 Nummular dermatitis: Secondary | ICD-10-CM | POA: Diagnosis not present

## 2017-03-05 DIAGNOSIS — L299 Pruritus, unspecified: Secondary | ICD-10-CM | POA: Diagnosis not present

## 2017-04-02 DIAGNOSIS — Z1331 Encounter for screening for depression: Secondary | ICD-10-CM | POA: Diagnosis not present

## 2017-04-02 DIAGNOSIS — Z1231 Encounter for screening mammogram for malignant neoplasm of breast: Secondary | ICD-10-CM | POA: Diagnosis not present

## 2017-04-02 DIAGNOSIS — E785 Hyperlipidemia, unspecified: Secondary | ICD-10-CM | POA: Diagnosis not present

## 2017-04-02 DIAGNOSIS — N959 Unspecified menopausal and perimenopausal disorder: Secondary | ICD-10-CM | POA: Diagnosis not present

## 2017-04-02 DIAGNOSIS — Z9181 History of falling: Secondary | ICD-10-CM | POA: Diagnosis not present

## 2017-04-02 DIAGNOSIS — Z0000 Encounter for general adult medical examination without abnormal findings: Secondary | ICD-10-CM | POA: Diagnosis not present

## 2017-04-02 DIAGNOSIS — Z1211 Encounter for screening for malignant neoplasm of colon: Secondary | ICD-10-CM | POA: Diagnosis not present

## 2017-04-16 DIAGNOSIS — M5136 Other intervertebral disc degeneration, lumbar region: Secondary | ICD-10-CM | POA: Diagnosis not present

## 2017-04-16 DIAGNOSIS — M797 Fibromyalgia: Secondary | ICD-10-CM | POA: Diagnosis not present

## 2017-04-16 DIAGNOSIS — G894 Chronic pain syndrome: Secondary | ICD-10-CM | POA: Diagnosis not present

## 2017-04-17 DIAGNOSIS — G9341 Metabolic encephalopathy: Secondary | ICD-10-CM | POA: Diagnosis not present

## 2017-04-17 DIAGNOSIS — I10 Essential (primary) hypertension: Secondary | ICD-10-CM | POA: Diagnosis not present

## 2017-04-17 DIAGNOSIS — Z79899 Other long term (current) drug therapy: Secondary | ICD-10-CM | POA: Diagnosis not present

## 2017-04-17 DIAGNOSIS — R569 Unspecified convulsions: Secondary | ICD-10-CM | POA: Diagnosis not present

## 2017-04-17 DIAGNOSIS — J45909 Unspecified asthma, uncomplicated: Secondary | ICD-10-CM | POA: Diagnosis not present

## 2017-04-17 DIAGNOSIS — R4020 Unspecified coma: Secondary | ICD-10-CM | POA: Diagnosis not present

## 2017-04-17 DIAGNOSIS — R251 Tremor, unspecified: Secondary | ICD-10-CM | POA: Diagnosis not present

## 2017-04-17 DIAGNOSIS — J9601 Acute respiratory failure with hypoxia: Secondary | ICD-10-CM | POA: Diagnosis not present

## 2017-04-17 DIAGNOSIS — R0602 Shortness of breath: Secondary | ICD-10-CM | POA: Diagnosis not present

## 2017-04-17 DIAGNOSIS — R509 Fever, unspecified: Secondary | ICD-10-CM | POA: Diagnosis not present

## 2017-04-17 DIAGNOSIS — J189 Pneumonia, unspecified organism: Secondary | ICD-10-CM | POA: Diagnosis not present

## 2017-04-17 DIAGNOSIS — G43909 Migraine, unspecified, not intractable, without status migrainosus: Secondary | ICD-10-CM | POA: Diagnosis not present

## 2017-04-17 DIAGNOSIS — S0990XA Unspecified injury of head, initial encounter: Secondary | ICD-10-CM | POA: Diagnosis not present

## 2017-04-17 DIAGNOSIS — T50901A Poisoning by unspecified drugs, medicaments and biological substances, accidental (unintentional), initial encounter: Secondary | ICD-10-CM | POA: Diagnosis not present

## 2017-04-17 DIAGNOSIS — I4891 Unspecified atrial fibrillation: Secondary | ICD-10-CM | POA: Diagnosis not present

## 2017-04-17 DIAGNOSIS — J9811 Atelectasis: Secondary | ICD-10-CM | POA: Diagnosis not present

## 2017-04-18 DIAGNOSIS — J189 Pneumonia, unspecified organism: Secondary | ICD-10-CM | POA: Diagnosis not present

## 2017-04-18 DIAGNOSIS — J45909 Unspecified asthma, uncomplicated: Secondary | ICD-10-CM | POA: Diagnosis not present

## 2017-04-18 DIAGNOSIS — T50901A Poisoning by unspecified drugs, medicaments and biological substances, accidental (unintentional), initial encounter: Secondary | ICD-10-CM | POA: Diagnosis not present

## 2017-04-18 DIAGNOSIS — R509 Fever, unspecified: Secondary | ICD-10-CM | POA: Diagnosis not present

## 2017-04-18 DIAGNOSIS — J9601 Acute respiratory failure with hypoxia: Secondary | ICD-10-CM | POA: Diagnosis not present

## 2017-04-18 DIAGNOSIS — I10 Essential (primary) hypertension: Secondary | ICD-10-CM | POA: Diagnosis not present

## 2017-04-18 DIAGNOSIS — I4891 Unspecified atrial fibrillation: Secondary | ICD-10-CM | POA: Diagnosis not present

## 2017-04-18 DIAGNOSIS — G9341 Metabolic encephalopathy: Secondary | ICD-10-CM | POA: Diagnosis not present

## 2017-04-19 DIAGNOSIS — J45901 Unspecified asthma with (acute) exacerbation: Secondary | ICD-10-CM | POA: Diagnosis not present

## 2017-04-25 DIAGNOSIS — Z79899 Other long term (current) drug therapy: Secondary | ICD-10-CM | POA: Diagnosis not present

## 2017-04-25 DIAGNOSIS — J9811 Atelectasis: Secondary | ICD-10-CM | POA: Diagnosis not present

## 2017-04-25 DIAGNOSIS — N390 Urinary tract infection, site not specified: Secondary | ICD-10-CM | POA: Diagnosis not present

## 2017-04-25 DIAGNOSIS — R0902 Hypoxemia: Secondary | ICD-10-CM | POA: Diagnosis not present

## 2017-04-30 ENCOUNTER — Telehealth: Payer: Self-pay

## 2017-04-30 NOTE — Telephone Encounter (Signed)
SENT REFERRAL TO SCHEDULING 

## 2017-05-02 DIAGNOSIS — H04123 Dry eye syndrome of bilateral lacrimal glands: Secondary | ICD-10-CM | POA: Diagnosis not present

## 2017-05-02 DIAGNOSIS — H524 Presbyopia: Secondary | ICD-10-CM | POA: Diagnosis not present

## 2017-05-11 DIAGNOSIS — M85851 Other specified disorders of bone density and structure, right thigh: Secondary | ICD-10-CM | POA: Diagnosis not present

## 2017-05-11 DIAGNOSIS — Z1231 Encounter for screening mammogram for malignant neoplasm of breast: Secondary | ICD-10-CM | POA: Diagnosis not present

## 2017-05-11 DIAGNOSIS — N959 Unspecified menopausal and perimenopausal disorder: Secondary | ICD-10-CM | POA: Diagnosis not present

## 2017-05-15 NOTE — Progress Notes (Signed)
Referring-Connie Tomasa Blase MD Reason for referral-Atrial fibrillation  HPI: 62 yo female for evaluation of atrial fibrillation at request of [name]. Seen previously but not since 11/15. Seen in past for palpitations. Cardionet 10/12 revealed runs of afib/flutter. Admitted in 2013 with syncopal episode. Was seen by cardiology, neurology and psychiatry. Etiology of the events unclear. Differential included cardiogenic syncope, psychogenic syncope, pseudoseizures and malingering. Echo 11/15 showed normal LV function; mild LAE. Stress echo 11/15 negative but pt did not achieve THR. Event monitor 11/15 showed sinus with pacs.  Patient was admitted at [facility] in April 2019 with cough, dyspnea, fever and altered mental status.  She was hypoxic.  Chest CT showed no pulmonary embolus.  She was treated with Levaquin for possible urinary tract infection.  Note I do not have all records available.  Since she was discharged from the hospital she notes occasional dyspnea.  She describes pedal edema.  She has chest pain in various locations on her chest that are nonexertional.  Some increased with inspiration.  She continues to have intermittent palpitations and what she feels is atrial fibrillation.  Current Outpatient Medications  Medication Sig Dispense Refill  . ALPRAZolam (XANAX) 1 MG tablet Take 2 tablets by mouth at bedtime.    . diclofenac (VOLTAREN) 75 MG EC tablet Take 1 tablet by mouth daily.    [name] diltiazem (CARDIZEM CD) 240 MG 24 hr capsule Take 1 capsule by mouth daily.    [name] eletriptan (RELPAX) 40 MG tablet One tablet by mouth as needed for migraine headache.  If the headache improves and then returns, dose may be repeated after 2 hours have elapsed since first dose (do not exceed 80 mg per day). may repeat in 2 hours if necessary     . metoprolol tartrate (LOPRESSOR) 25 MG tablet Take 1 tablet (25 mg total) by mouth at bedtime. 30 tablet 0  . tiZANidine (ZANAFLEX) 4 MG tablet Take 3  tablets by mouth at bedtime. 1 during the day if needed     No current facility-administered medications for this visit.     Allergies  Allergen Reactions  . Dht [Dihydrotachysterol] Dermatitis    Scar at injection site  . Penicillins     Almost died  . Promethazine Hcl   . Sulfa Antibiotics      Past Medical History:  Diagnosis Date  . Asthma   . Atrial fibrillation (HCC)   . Depression   . Fibromyalgia   . Hyperlipidemia   . Hypertension   . IBS (irritable bowel syndrome)   . Migraine   . Nephrolithiasis     Past Surgical History:  Procedure Laterality Date  . ABDOMINAL HYSTERECTOMY    . APPENDECTOMY    . CESAREAN SECTION     x 2  . CHOLECYSTECTOMY    . OOPHORECTOMY    . TONSILLECTOMY    . TUBAL LIGATION      Social History   Socioeconomic History  . Marital status: Married    Spouse name: Not on file  . Number of children: 2  . Years of education: Not on file  . Highest education level: Not on file  Occupational History    Comment: Disabled  Social Needs  . Financial resource strain: Not on file  . Food insecurity:    Worry: Not on file    Inability: Not on file  . Transportation needs:    Medical: Not on file    Non-medical: Not on file  Tobacco Use  . Smoking status: Never Smoker  . Smokeless tobacco: Never Used  Substance and Sexual Activity  . Alcohol use: Yes    Alcohol/week: 0.0 oz    Comment: rarely  . Drug use: No  . Sexual activity: Not on file  Lifestyle  . Physical activity:    Days per week: Not on file    Minutes per session: Not on file  . Stress: Not on file  Relationships  . Social connections:    Talks on phone: Not on file    Gets together: Not on file    Attends religious service: Not on file    Active member of club or organization: Not on file    Attends meetings of clubs or organizations: Not on file    Relationship status: Not on file  . Intimate partner violence:    Fear of current or ex partner: Not on file      Emotionally abused: Not on file    Physically abused: Not on file    Forced sexual activity: Not on file  Other Topics Concern  . Not on file  Social History Narrative  . Not on file    Family History  Problem Relation Age of Onset  . Cancer Father   . Heart disease Father        atrial fibrillation and CHF  . Heart disease Mother        atrial fibrillation and CHF    ROS: Patient describes fibromyalgia no fevers or chills, productive cough, hemoptysis, dysphasia, odynophagia, melena, hematochezia, dysuria, hematuria, rash, seizure activity, orthopnea, PND, claudication. Remaining systems are negative.  Physical Exam:   Blood pressure 113/77, pulse (!) 51, height  (1.702 m), weight 237 lb (107.5 kg).  General:  Well developed/well nourished in NAD Skin warm/dry Patient not depressed No peripheral clubbing Back-normal HEENT-normal/normal eyelids Neck supple/normal carotid upstroke bilaterally; no bruits; no JVD; no thyromegaly chest - CTA/ normal expansion CV - RRR/normal S1 and S2; no murmurs, rubs or gallops;  PMI nondisplaced Abdomen -NT/ND, no HSM, no mass, + bowel sounds, no bruit 2+ femoral pulses, no bruits Ext-no edema, chords, 2+ DP Neuro-grossly nonfocal  ECG -sinus rhythm at a rate of 52.  No ST changes.  Personally reviewed  A/P  1 history of paroxysmal atrial fibrillation-patient is in sinus rhythm today.  She does have palpitations that she feels may be atrial fibrillation at times.  Continue Cardizem and beta-blocker.  She has embolic risk factors of female sex and hypertension.  Long discussion today concerning risk of CVA and anticoagulation.  I would prefer that she be on apixaban.  However she takes 2000 mg of Motrin daily for pain from her fibromyalgia.  I am therefore concerned about the potential interaction between the nonsteroidal and apixaban.  She states that she has not been approved for any other pain medications and cannot go without.  We  have elected therefore not to begin apixaban.  She will discuss further pain medications with her primary care physician and we will consider beginning apixaban in the future if there are alternatives other than nonsteroidals.  2 hypertension-blood pressure is controlled.  Continue present medications.  3 hypoxia-Patient was hypoxic during recent admission.  She is on home oxygen but her saturations have been doing well off of oxygen.  She is scheduled to see pulmonary later this month.  This does not sound to be cardiac related and she is not volume overloaded on examination.  Schedule  echocardiogram to assess LV function.  Olga Millers, MD

## 2017-05-16 ENCOUNTER — Encounter: Payer: Self-pay | Admitting: Cardiology

## 2017-05-16 ENCOUNTER — Ambulatory Visit (INDEPENDENT_AMBULATORY_CARE_PROVIDER_SITE_OTHER): Payer: Medicare Other | Admitting: Cardiology

## 2017-05-16 VITALS — BP 113/77 | HR 51 | Ht 67.0 in | Wt 237.0 lb

## 2017-05-16 DIAGNOSIS — R0902 Hypoxemia: Secondary | ICD-10-CM | POA: Diagnosis not present

## 2017-05-16 DIAGNOSIS — I10 Essential (primary) hypertension: Secondary | ICD-10-CM

## 2017-05-16 DIAGNOSIS — I480 Paroxysmal atrial fibrillation: Secondary | ICD-10-CM | POA: Diagnosis not present

## 2017-05-16 NOTE — Patient Instructions (Signed)

## 2017-05-19 DIAGNOSIS — J45901 Unspecified asthma with (acute) exacerbation: Secondary | ICD-10-CM | POA: Diagnosis not present

## 2017-05-21 ENCOUNTER — Ambulatory Visit (HOSPITAL_COMMUNITY): Payer: Medicare Other | Attending: Cardiovascular Disease

## 2017-05-21 ENCOUNTER — Other Ambulatory Visit: Payer: Self-pay

## 2017-05-21 DIAGNOSIS — E785 Hyperlipidemia, unspecified: Secondary | ICD-10-CM | POA: Diagnosis not present

## 2017-05-21 DIAGNOSIS — I10 Essential (primary) hypertension: Secondary | ICD-10-CM | POA: Insufficient documentation

## 2017-05-21 DIAGNOSIS — I4891 Unspecified atrial fibrillation: Secondary | ICD-10-CM | POA: Diagnosis not present

## 2017-05-21 DIAGNOSIS — I480 Paroxysmal atrial fibrillation: Secondary | ICD-10-CM

## 2017-05-21 DIAGNOSIS — I081 Rheumatic disorders of both mitral and tricuspid valves: Secondary | ICD-10-CM | POA: Insufficient documentation

## 2017-05-21 MED ORDER — PERFLUTREN LIPID MICROSPHERE
1.0000 mL | INTRAVENOUS | Status: AC | PRN
  Administered 2017-05-21: 3 mL via INTRAVENOUS

## 2017-05-30 DIAGNOSIS — M797 Fibromyalgia: Secondary | ICD-10-CM | POA: Diagnosis not present

## 2017-05-30 DIAGNOSIS — E785 Hyperlipidemia, unspecified: Secondary | ICD-10-CM | POA: Diagnosis not present

## 2017-05-30 DIAGNOSIS — I480 Paroxysmal atrial fibrillation: Secondary | ICD-10-CM | POA: Diagnosis not present

## 2017-05-31 DIAGNOSIS — Z79899 Other long term (current) drug therapy: Secondary | ICD-10-CM | POA: Diagnosis not present

## 2017-05-31 DIAGNOSIS — R300 Dysuria: Secondary | ICD-10-CM | POA: Diagnosis not present

## 2017-05-31 DIAGNOSIS — E785 Hyperlipidemia, unspecified: Secondary | ICD-10-CM | POA: Diagnosis not present

## 2017-06-06 ENCOUNTER — Ambulatory Visit: Payer: Medicare Other | Admitting: Internal Medicine

## 2017-06-06 ENCOUNTER — Ambulatory Visit (INDEPENDENT_AMBULATORY_CARE_PROVIDER_SITE_OTHER)
Admission: RE | Admit: 2017-06-06 | Discharge: 2017-06-06 | Disposition: A | Discharge status: Home / Self Care | Payer: Medicare Other | Source: Ambulatory Visit | Attending: Internal Medicine | Admitting: Internal Medicine

## 2017-06-06 ENCOUNTER — Encounter: Payer: Self-pay | Admitting: Internal Medicine

## 2017-06-06 VITALS — BP 142/84 | HR 54 | Ht 67.0 in | Wt 237.8 lb

## 2017-06-06 DIAGNOSIS — Z8701 Personal history of pneumonia (recurrent): Secondary | ICD-10-CM | POA: Diagnosis not present

## 2017-06-06 DIAGNOSIS — Z87898 Personal history of other specified conditions: Secondary | ICD-10-CM | POA: Diagnosis not present

## 2017-06-06 DIAGNOSIS — Z9189 Other specified personal risk factors, not elsewhere classified: Secondary | ICD-10-CM

## 2017-06-06 DIAGNOSIS — J189 Pneumonia, unspecified organism: Secondary | ICD-10-CM | POA: Diagnosis not present

## 2017-06-06 LAB — NITRIC OXIDE: Nitric Oxide: 19

## 2017-06-06 MED FILL — Perflutren Lipid Microsphere IV Susp 6.52 MG/ML: INTRAVENOUS | Qty: 2 | Status: AC

## 2017-06-06 NOTE — Progress Notes (Signed)
   Subjective:    Patient ID: Connie Jackson, female    DOB: 1955-08-16, 62 y.o.   MRN: [id number]  HPI    Review of Systems  Constitutional: Negative for fever and unexpected weight change.  HENT: Positive for postnasal drip, sinus pressure and sneezing. Negative for congestion, dental problem, ear pain, nosebleeds, rhinorrhea, sore throat and trouble swallowing.   Eyes: Positive for redness and itching.  Respiratory: Positive for cough, chest tightness, shortness of breath and wheezing.   Cardiovascular: Positive for chest pain, palpitations and leg swelling.  Gastrointestinal: Positive for nausea. Negative for vomiting.  Genitourinary: Negative for dysuria.  Musculoskeletal: Positive for joint swelling.  Skin: Negative for rash.  Allergic/Immunologic: Negative.  Negative for environmental allergies, food allergies and immunocompromised state.  Neurological: Positive for headaches.  Hematological: Bruises/bleeds easily.  Psychiatric/Behavioral: Positive for dysphoric mood. The patient is nervous/anxious.        Objective:   Physical Exam        Assessment & Plan:

## 2017-06-06 NOTE — Patient Instructions (Addendum)
Recurrent pneumonia - dec 2018 and April 2019 Passive smoking Nocturnal wheezing with normal nitric oxide   Plan  - cxr 2 view 06/06/2017 as followup  - ono room air  - full PFT   followup  - next few weeks to see me or app but after completing above

## 2017-06-06 NOTE — Progress Notes (Signed)
Subjective:     Patient ID: Connie Jackson, female   DOB: 1955-07-12, 62 y.o.   MRN: [id number]  PCP Paulina Fusi, MD  HPI   IOV 06/06/2017  Chief Complaint  Patient presents with  . Consult    Referred by Dr. Foye Deer due to hypoxia.  Pt was hospitalized Dec 2018 with pna in both lungs and April 2019 with copd. Pt was sent home from hospital on O2 which she uses if needed. Pt has c/o chest pain, SOB all the time that is worse with exertion, and coughs after eating.   Connie Jackson 62 y.o. female from [address] [place] [address] is assisted to my patient Sol Blazing who suffers from interstitial lung disease secondary to chemotherapeutic agent.  Patient has been referred here by her sister following 2 recent hospitalizations December 2018 and April 2019.  Per the patient she says she is in passive smoker with her having grown up with parent smoking, her sister around around smoking in the past and her husband smoking until few to several years ago.  Patient herself is a non-smoker.  She says that she had childhood asthma and she thought she outgrew that.  However for the last 2 years or so at least 3 times a week she wakes up in the night or early in the morning having wheezing.  However she says this has not been worked up.  In this backdrop in December 2018 she developed headaches and confusion and shortness of breath and got hospitalized to [facility]. Personal visualization of imaging.  In 2013 he had a clear chest x-ray.  In December 2018 he had a CT scan of the chest that I personally visualized.  This showed perihilar pulmonary infiltrates.  She got treated for pneumonia and discharged on nebulizers.  She has not used her nebulizer because of fear of atrial fibrillation getting worse and she is controlled on that with Cardizem and metoprolol.  Then then in April 2019 she had a similar headache but this time she felt her neck was throbbing as  though her "carotid arteries were being squeezed" and she got admitted again CT chest ruled out pulmonary embolism.  She was told she had COPD exacerbation but in my personal visualization and I agree with the radiologist finding is that he has right upper lobe posterior segment atelectasis that was new in April 2019 compared to December 2018.  We do not have those records with this but I was able to review the images and visualize that..  Since then she is been back to baseline but still has that wheezing which is part of her baseline.  This wheezing occurs mostly at night or early in the morning disturbing her sleep.  She is not using albuterol.  She is not on any amiodarone.  Since April 2019 CT chest that has been no further imaging.      Reports her mom told her she snores but that was > 15 year ago.  She does have obesity and micrognathia. Denies dysphagia or GERD    #2015 cardiac echocardiogram stress test normal. Exam nitric oxide test today normal    has a past medical history of Asthma, Atrial fibrillation (HCC), Depression, Fibromyalgia, Hyperlipidemia, Hypertension, IBS (irritable bowel syndrome), Migraine, and Nephrolithiasis.   reports that she has never smoked. She has never used smokeless tobacco.  Past Surgical History:  Procedure Laterality Date  . ABDOMINAL HYSTERECTOMY    .  APPENDECTOMY    . CESAREAN SECTION     x 2  . CHOLECYSTECTOMY    . OOPHORECTOMY    . TONSILLECTOMY    . TUBAL LIGATION      Allergies  Allergen Reactions  . Dht [Dihydrotachysterol] Dermatitis    Scar at injection site  . Penicillins     Almost died  . Promethazine Hcl   . Sulfa Antibiotics      There is no immunization history on file for this patient.  Family History  Problem Relation Age of Onset  . Cancer Father   . Heart disease Father        atrial fibrillation and CHF  . Heart disease Mother        atrial fibrillation and CHF     Current Outpatient Medications:  .   ALPRAZolam (XANAX) 1 MG tablet, Take 2 tablets by mouth at bedtime., Disp: , Rfl:  .  Biotin [address] MCG TABS, Take 10,000 mcg by mouth daily., Disp: , Rfl:  .  cetirizine (ZYRTEC) 10 MG tablet, Take 10 mg by mouth at bedtime., Disp: , Rfl:  .  citalopram (CELEXA) 20 MG tablet, , Disp: , Rfl:  .  diltiazem (CARDIZEM CD) 240 MG 24 hr capsule, Take 1 capsule by mouth daily., Disp: , Rfl:  .  eletriptan (RELPAX) 40 MG tablet, One tablet by mouth as needed for migraine headache.  If the headache improves and then returns, dose may be repeated after 2 hours have elapsed since first dose (do not exceed 80 mg per day). may repeat in 2 hours if necessary , Disp: , Rfl:  .  ELIQUIS 5 MG TABS tablet, , Disp: , Rfl:  .  meclizine (ANTIVERT) 25 MG tablet, Take 25 mg by mouth 3 (three) times daily as needed for dizziness., Disp: , Rfl:  .  metoprolol tartrate (LOPRESSOR) 25 MG tablet, Take 1 tablet (25 mg total) by mouth at bedtime., Disp: 30 tablet, Rfl: 0 .  rosuvastatin (CRESTOR) 10 MG tablet, , Disp: , Rfl:  .  tiZANidine (ZANAFLEX) 4 MG tablet, Take 3 tablets by mouth at bedtime. 1 during the day if needed, Disp: , Rfl:  .  traMADol (ULTRAM) 50 MG tablet, Take by mouth every 6 (six) hours as needed., Disp: , Rfl:    Review of Systems     Objective:   Physical Exam  Constitutional: She is oriented to person, place, and time. She appears well-developed and well-nourished. No distress.  HENT:  Head: Normocephalic and atraumatic.  Right Ear: External ear normal.  Left Ear: External ear normal.  Mouth/Throat: Oropharynx is clear and moist. No oropharyngeal exudate.  mallampatti clas 3 ? micrognathia  Eyes: Pupils are equal, round, and reactive to light. Conjunctivae and EOM are normal. Right eye exhibits no discharge. Left eye exhibits no discharge. No scleral icterus.  Neck: Normal range of motion. Neck supple. No JVD present. No tracheal deviation present. No thyromegaly present.  Cardiovascular:  Normal rate, regular rhythm, normal heart sounds and intact distal pulses. Exam reveals no gallop and no friction rub.  No murmur heard. Pulmonary/Chest: Effort normal and breath sounds normal. No respiratory distress. She has no wheezes. She has no rales. She exhibits no tenderness.  Abdominal: Soft. Bowel sounds are normal. She exhibits no distension and no mass. There is no tenderness. There is no rebound and no guarding.  Musculoskeletal: Normal range of motion. She exhibits no edema or tenderness.  Lymphadenopathy:    She has no  cervical adenopathy.  Neurological: She is alert and oriented to person, place, and time. She has normal reflexes. No cranial nerve deficit. She exhibits normal muscle tone. Coordination normal.  Skin: Skin is warm and dry. No rash noted. She is not diaphoretic. No erythema. No pallor.  Psychiatric: She has a normal mood and affect. Her behavior is normal. Judgment and thought content normal.  Vitals reviewed.  Today's Vitals   06/06/17 1417  BP: (!) 142/84  Pulse: (!) 54  SpO2: 97%  Weight: 237 lb 12.8 oz (107.9 kg)  Height:  (1.702 m)    Body mass index is 37.24 kg/m.        Assessment:     Recurrent pneumonia - dec 2018 and April 2019 Passive smoking Nocturnal wheezing with normal nitric oxide Passive smoking    ICD-10-CM   1. History of pneumonia Z87.01 Pulmonary Function Test    Pulse oximetry, overnight    DG Chest 2 View  2. At risk from passive smoking Z91.89   3. History of wheezing Z87.898        Plan:      Plan  - cxr 2 view 06/06/2017 as followup  - ono room air  - full PFT   followup  - next few weeks to see me or app but after completing above    Dr. Kalman Shan, M.D., [facility].C.P Pulmonary and Critical Care Medicine Staff Physician, [facility] Center Director - Interstitial Lung Disease  Program  Pulmonary Fibrosis [facility] at [facility] [place], [place], [address]  Pager:  [phone], If no answer or between  15:00h - 7:00h: call [phone] Telephone: [phone]

## 2017-06-06 NOTE — Addendum Note (Signed)
Addended by: Wyvonne Lenz on: 06/06/2017 03:22 PM   Modules accepted: Orders

## 2017-06-19 DIAGNOSIS — J45901 Unspecified asthma with (acute) exacerbation: Secondary | ICD-10-CM | POA: Diagnosis not present

## 2017-06-20 ENCOUNTER — Encounter: Payer: Self-pay | Admitting: Internal Medicine

## 2017-06-21 DIAGNOSIS — R062 Wheezing: Secondary | ICD-10-CM | POA: Diagnosis not present

## 2017-06-26 ENCOUNTER — Telehealth: Payer: Self-pay | Admitting: Internal Medicine

## 2017-06-26 NOTE — Telephone Encounter (Signed)
Called and spoke with patient, she states that she is wanting an order sent so that she can have her o2 tanks discontinued. She no longer uses them. She is also requesting results from her ONO.  MR please advise

## 2017-06-26 NOTE — Telephone Encounter (Signed)
Called patient, phone rang busy twice. Will call back.

## 2017-06-26 NOTE — Telephone Encounter (Signed)
ono done 06/21/17 - shows time </= 88% at 337 minutes . She ias also breadycardic with HR 40-60 during slee[p and this could jus be a reflection of the cardizem  cxr 5/22/1`9 - clear  Plan - will seee her 07/11/17 to discuss:  it seems she migt have OSA because she Is still desats at night  - does she want to turn in the night o2 system or day portabl system?  Dr. Kalman ShanMurali Hendrick Pavich, M.D., [facility].C.P Pulmonary and Critical Care Medicine Staff Physician, [facility] Center Director - Interstitial Lung Disease  Program  Pulmonary Fibrosis [facility] at [facility] [place], [place], [id number]  Pager: [phone], If no answer or between  15:00h - 7:00h: call [phone] Telephone: [phone]

## 2017-06-27 NOTE — Telephone Encounter (Signed)
Spoke with pt. She is aware of her results. Pt wants to wait until her appointment to discuss oxygen therapy. Nothing further was needed.

## 2017-06-29 ENCOUNTER — Encounter: Payer: Self-pay | Admitting: Internal Medicine

## 2017-06-29 NOTE — Telephone Encounter (Signed)
ERROR

## 2017-07-03 DIAGNOSIS — I480 Paroxysmal atrial fibrillation: Secondary | ICD-10-CM | POA: Diagnosis not present

## 2017-07-03 DIAGNOSIS — G43009 Migraine without aura, not intractable, without status migrainosus: Secondary | ICD-10-CM | POA: Diagnosis not present

## 2017-07-03 DIAGNOSIS — M797 Fibromyalgia: Secondary | ICD-10-CM | POA: Diagnosis not present

## 2017-07-03 DIAGNOSIS — Z1339 Encounter for screening examination for other mental health and behavioral disorders: Secondary | ICD-10-CM | POA: Diagnosis not present

## 2017-07-11 ENCOUNTER — Ambulatory Visit: Payer: Medicare Other | Admitting: Internal Medicine

## 2017-07-19 DIAGNOSIS — J45901 Unspecified asthma with (acute) exacerbation: Secondary | ICD-10-CM | POA: Diagnosis not present

## 2017-08-19 DIAGNOSIS — J45901 Unspecified asthma with (acute) exacerbation: Secondary | ICD-10-CM | POA: Diagnosis not present

## 2017-08-24 ENCOUNTER — Ambulatory Visit: Payer: Medicare Other | Admitting: Internal Medicine

## 2017-09-14 NOTE — Progress Notes (Deleted)
 @Patient  ID: Connie CarmelPhyllis M Jackson, female    DOB: Dec 25, 1955, 62 y.o.   MRN: [id number]  No chief complaint on file.   Referring provider: Paulina FusiSchultz, Douglas E, MD  HPI: 62 year old female, passive former smoker. PMH afib, palpitations, hyperlipidemia, migraine headache, syncope, edema. [facility], patient of Dr. Marchelle Gearingamaswamy referred by Dr. Tomasa BlaseSchultz for hypoxia. Seen on 06/06/17 for initial consult. She has had two recent hospitalizations; one in December 2018 for bilateral PNA and another in April 2019 dx with COPD and sent home from hospital with oxygen which she uses as needed.   Reports childhood asthma that she grew out of. For the past two year she has been waking up at night or early morning with wheezing. CXR showed resolved right upper lobe infiltrate. No active cardiopulmonary disease. FENO normal. Needs to repeat PFTs.        Allergies  Allergen Reactions  . Dht [Dihydrotachysterol] Dermatitis    Scar at injection site  . Penicillins     Almost died  . Promethazine Hcl   . Sulfa Antibiotics      There is no immunization history on file for this patient.  Past Medical History:  Diagnosis Date  . Asthma   . Atrial fibrillation (HCC)   . Depression   . Fibromyalgia   . Hyperlipidemia   . Hypertension   . IBS (irritable bowel syndrome)   . Migraine   . Nephrolithiasis     Tobacco History: Social History   Tobacco Use  Smoking Status Never Smoker  Smokeless Tobacco Never Used   Counseling given: Not Answered   Outpatient Medications Prior to Visit  Medication Sig Dispense Refill  . ALPRAZolam (XANAX) 1 MG tablet Take 2 tablets by mouth at bedtime.    . Biotin 9528410000 MCG TABS Take 10,000 mcg by mouth daily.    . cetirizine (ZYRTEC) 10 MG tablet Take 10 mg by mouth at bedtime.    . citalopram (CELEXA) 20 MG tablet     . diltiazem (CARDIZEM CD) 240 MG 24 hr capsule Take 1 capsule by mouth daily.    [name]. eletriptan (RELPAX) 40 MG tablet One tablet  by mouth as needed for migraine headache.  If the headache improves and then returns, dose may be repeated after 2 hours have elapsed since first dose (do not exceed 80 mg per day). may repeat in 2 hours if necessary     . ELIQUIS 5 MG TABS tablet     . meclizine (ANTIVERT) 25 MG tablet Take 25 mg by mouth 3 (three) times daily as needed for dizziness.    . metoprolol tartrate (LOPRESSOR) 25 MG tablet Take 1 tablet (25 mg total) by mouth at bedtime. 30 tablet 0  . rosuvastatin (CRESTOR) 10 MG tablet     . tiZANidine (ZANAFLEX) 4 MG tablet Take 3 tablets by mouth at bedtime. 1 during the day if needed    . traMADol (ULTRAM) 50 MG tablet Take by mouth every 6 (six) hours as needed.     No facility-administered medications prior to visit.       Review of Systems  Review of Systems   Physical Exam  There were no vitals taken for this visit. Physical Exam   Lab Results:  CBC    Component Value Date/Time   WBC 8.2 08/19/2011 0640   RBC 3.91 08/19/2011 0640   HGB 11.8 (L) 08/19/2011 0640   HCT 36.4 08/19/2011 0640   PLT 305 08/19/2011 0640  MCV 93.1 08/19/2011 0640   MCH 30.2 08/19/2011 0640   MCHC 32.4 08/19/2011 0640   RDW 13.5 08/19/2011 0640   LYMPHSABS 1.7 08/18/2011 1836   MONOABS 0.6 08/18/2011 1836   EOSABS 0.1 08/18/2011 1836   BASOSABS 0.0 08/18/2011 1836    BMET    Component Value Date/Time   NA 142 08/19/2011 0640   K 3.8 08/19/2011 0640   CL 107 08/19/2011 0640   CO2 23 08/19/2011 0640   GLUCOSE 91 08/19/2011 0640   BUN 16 08/19/2011 0640   CREATININE 0.82 08/19/2011 0640   CALCIUM 9.2 08/19/2011 0640   GFRNONAA 79 (L) 08/19/2011 0640   GFRAA >90 08/19/2011 0640    BNP No results found for: BNP  ProBNP No results found for: PROBNP  Imaging: No results found.   Assessment & Plan:   No problem-specific Assessment & Plan notes found for this encounter.     Glenford Bayley, NP 09/14/2017

## 2017-09-18 ENCOUNTER — Ambulatory Visit: Payer: Medicare Other | Admitting: Primary Care

## 2017-09-19 DIAGNOSIS — J45901 Unspecified asthma with (acute) exacerbation: Secondary | ICD-10-CM | POA: Diagnosis not present

## 2017-10-02 ENCOUNTER — Ambulatory Visit: Payer: Medicare Other | Admitting: Primary Care

## 2017-10-02 ENCOUNTER — Other Ambulatory Visit (INDEPENDENT_AMBULATORY_CARE_PROVIDER_SITE_OTHER): Payer: Medicare Other

## 2017-10-02 ENCOUNTER — Ambulatory Visit (INDEPENDENT_AMBULATORY_CARE_PROVIDER_SITE_OTHER): Payer: Medicare Other | Admitting: Primary Care

## 2017-10-02 ENCOUNTER — Encounter: Payer: Self-pay | Admitting: Primary Care

## 2017-10-02 VITALS — BP 122/88 | HR 68 | Ht 64.96 in | Wt 237.2 lb

## 2017-10-02 DIAGNOSIS — R0609 Other forms of dyspnea: Secondary | ICD-10-CM

## 2017-10-02 DIAGNOSIS — R5381 Other malaise: Secondary | ICD-10-CM

## 2017-10-02 DIAGNOSIS — Z8701 Personal history of pneumonia (recurrent): Secondary | ICD-10-CM | POA: Diagnosis not present

## 2017-10-02 DIAGNOSIS — J45909 Unspecified asthma, uncomplicated: Secondary | ICD-10-CM | POA: Diagnosis not present

## 2017-10-02 DIAGNOSIS — D729 Disorder of white blood cells, unspecified: Secondary | ICD-10-CM

## 2017-10-02 DIAGNOSIS — Z77120 Contact with and (suspected) exposure to mold (toxic): Secondary | ICD-10-CM | POA: Diagnosis not present

## 2017-10-02 DIAGNOSIS — R0683 Snoring: Secondary | ICD-10-CM | POA: Diagnosis not present

## 2017-10-02 LAB — CBC WITH DIFFERENTIAL/PLATELET
BASOS ABS: 0.1 10*3/uL (ref 0.0–0.1)
BASOS PCT: 0.5 % (ref 0.0–3.0)
EOS ABS: 0.1 10*3/uL (ref 0.0–0.7)
Eosinophils Relative: 0.9 % (ref 0.0–5.0)
HCT: 43.8 % (ref 36.0–46.0)
Hemoglobin: 14.5 g/dL (ref 12.0–15.0)
Lymphocytes Relative: 15.6 % (ref 12.0–46.0)
Lymphs Abs: 2.5 10*3/uL (ref 0.7–4.0)
MCHC: 33.1 g/dL (ref 30.0–36.0)
MCV: 90.4 fl (ref 78.0–100.0)
MONO ABS: 0.7 10*3/uL (ref 0.1–1.0)
Monocytes Relative: 4.2 % (ref 3.0–12.0)
Neutro Abs: 12.8 10*3/uL — ABNORMAL HIGH (ref 1.4–7.7)
Neutrophils Relative %: 78.8 % — ABNORMAL HIGH (ref 43.0–77.0)
Platelets: 516 10*3/uL — ABNORMAL HIGH (ref 150.0–400.0)
RBC: 4.84 Mil/uL (ref 3.87–5.11)
RDW: 14.9 % (ref 11.5–15.5)
WBC: 16.3 10*3/uL — ABNORMAL HIGH (ref 4.0–10.5)

## 2017-10-02 LAB — PULMONARY FUNCTION TEST
DL/VA % pred: 107 %
DL/VA: 5.3 ml/min/mmHg/L
DLCO unc % pred: 94 %
DLCO unc: 24.22 ml/min/mmHg
FEF 25-75 Post: 4.47 L/s
FEF 25-75 Pre: 3.27 L/s
FEF2575-%Change-Post: 36 %
FEF2575-%Pred-Post: 191 %
FEF2575-%Pred-Pre: 139 %
FEV1-%Change-Post: 8 %
FEV1-%Pred-Post: 98 %
FEV1-%Pred-Pre: 90 %
FEV1-Post: 2.56 L
FEV1-Pre: 2.37 L
FEV1FVC-%Change-Post: 1 %
FEV1FVC-%Pred-Pre: 111 %
FEV6-%Change-Post: 6 %
FEV6-%Pred-Post: 89 %
FEV6-%Pred-Pre: 83 %
FEV6-Post: 2.91 L
FEV6-Pre: 2.73 L
FEV6FVC-%Pred-Post: 104 %
FEV6FVC-%Pred-Pre: 104 %
FVC-%Change-Post: 6 %
FVC-%Pred-Post: 86 %
FVC-%Pred-Pre: 80 %
FVC-Post: 2.91 L
FVC-Pre: 2.73 L
Post FEV1/FVC ratio: 88 %
Post FEV6/FVC ratio: 100 %
Pre FEV1/FVC ratio: 87 %
Pre FEV6/FVC Ratio: 100 %
RV % pred: 81 %
RV: 1.68 L
TLC % pred: 91 %
TLC: 4.75 L

## 2017-10-02 MED ORDER — MONTELUKAST SODIUM 10 MG PO TABS: 10.0000 mg | ORAL_TABLET | Freq: Every day | ORAL | 11 refills | Status: DC

## 2017-10-02 MED ORDER — MOMETASONE FUROATE 100 MCG/ACT IN AERO: 2.0000 | INHALATION_SPRAY | Freq: Two times a day (BID) | RESPIRATORY_TRACT | 1 refills | Status: AC

## 2017-10-02 NOTE — Progress Notes (Signed)
@Patient  ID: Connie Jackson, female    DOB: 06-03-1955, 62 y.o.   MRN: [id number]  Chief Complaint  Patient presents with  . Follow-up    PFT completed today-wheezing at night, O2 drops at night during sleep study, weekness, SOB with exertion,for years    Referring provider: Paulina Fusi, MD  HPI: 62 year old female, passive former smoker. PMH afib, palpitations, hyperlipidemia, migraine headache, syncope, edema. New to [facility] pulmonary, patient of Dr. Marchelle Gearing referred by Dr. Tomasa Blase for hypoxia. Seen on 06/06/17 for initial consult. She has had two recent hospitalizations; one in December 2018 for bilateral PNA and another in April 2019 dx with COPD and sent home from hospital with oxygen which she uses as needed. Reports childhood asthma that she grew out of. Complains of sob with exertion and coughing after eating. For the past two year she has been waking up at night or early morning with wheezing. Needs to repeat PFTs and check ONO.   10/04/2017 Presents today for follow-up visit with PFTS. Patient is accompanied by her husband. Lives in house full of black mold (brought in pictures), states that she stays in her bedroom 24/7 unless she needs to go to the kitchen. She has cleaned walls and windows with bleach multiple times and painted over it but mold has returns. She can't afford to have mold professionally removed or to purchase a dehumidifier. Has central air and heat. Husband smokes, but not in house. Previous owners smoked in house. One pet dog.   Hx anxiety, bipolar disorder and claustrophobia. Patient is very anxious and tremulous. Complains of fatigue, states that she is exhausted and worn out. Takes celexa 20mg  daily and 1 tab xanax at bedtime and another tab prn. States that she likes to go to church, went last Sunday and hasn't recovered. Standings causes her to be weak. Reports no strength. Takes Tramadol for Fibromyalgia, reports that it does subside pain.    Experiences SOB when she walks to the bathroom. She has not been wearing oxygen at night. Husband reports that she snores, stops breathing at night and gasp for air. Patient reports that she flails her arms at night and trys to hit giant spiders in her sleep. PFTs today showed no restriction or obstruction, normal flow vol loop. ONO low 79%, spent 8.6 consecutive minutes under 88%.    Significant testing reviwed: >>CXR 06/06/17- showed resolved right upper lobe infiltrate. No active cardiopulmonary disease.  >>Cardiac echocardiogram stress test - normal  >>FENO 10/02/2017- 17, normal >>PFTs 10/02/2017 - No restriction or obstruction. FVC 2.91 (86%), FEV1 2.56 (98%), Ratio 88. DLCO 24 (94%). Normal flow vol loop.  >>ONO- SpO2 low 79%. Spent 8.6 consecutive minutes under 88%.    Allergies  Allergen Reactions  . Dht [Dihydrotachysterol] Dermatitis    Scar at injection site  . Penicillins     Almost died  . Promethazine Hcl   . Sulfa Antibiotics      There is no immunization history on file for this patient.  Past Medical History:  Diagnosis Date  . Asthma   . Atrial fibrillation (HCC)   . Depression   . Fibromyalgia   . Hyperlipidemia   . Hypertension   . IBS (irritable bowel syndrome)   . Migraine   . Nephrolithiasis     Tobacco History: Social History   Tobacco Use  Smoking Status Never Smoker  Smokeless Tobacco Never Used   Counseling given: Not Answered   Outpatient Medications Prior to Visit  Medication Sig Dispense Refill  . ALPRAZolam (XANAX) 1 MG tablet Take 2 tablets by mouth at bedtime.    . Biotin 9604510000 MCG TABS Take 10,000 mcg by mouth daily.    . cetirizine (ZYRTEC) 10 MG tablet Take 10 mg by mouth at bedtime.    . citalopram (CELEXA) 20 MG tablet     . diltiazem (CARDIZEM CD) 240 MG 24 hr capsule Take 1 capsule by mouth daily.    [name]. eletriptan (RELPAX) 40 MG tablet One tablet by mouth as needed for migraine headache.  If the headache improves and then  returns, dose may be repeated after 2 hours have elapsed since first dose (do not exceed 80 mg per day). may repeat in 2 hours if necessary     . ELIQUIS 5 MG TABS tablet     . metoprolol tartrate (LOPRESSOR) 25 MG tablet Take 1 tablet (25 mg total) by mouth at bedtime. 30 tablet 0  . rosuvastatin (CRESTOR) 10 MG tablet     . tiZANidine (ZANAFLEX) 4 MG tablet Take 3 tablets by mouth at bedtime. 1 during the day if needed    . traMADol (ULTRAM) 50 MG tablet Take by mouth every 6 (six) hours as needed.    . meclizine (ANTIVERT) 25 MG tablet Take 25 mg by mouth 3 (three) times daily as needed for dizziness.     No facility-administered medications prior to visit.     Review of Systems  Review of Systems  Constitutional: Negative.   HENT: Negative.   Respiratory: Positive for shortness of breath. Negative for cough.   Cardiovascular: Negative.   Psychiatric/Behavioral: Positive for sleep disturbance.    Physical Exam  BP 122/88 (BP Location: Left Arm, Cuff Size: Normal)   Pulse 68   Ht 5' 4.96" (1.65 m)   Wt 237 lb 3.2 oz (107.6 kg)   SpO2 97%   BMI 39.52 kg/m  Physical Exam  Constitutional: She is oriented to person, place, and time. She appears well-developed and well-nourished. No distress.  HENT:  Head: Normocephalic and atraumatic.  Eyes: Pupils are equal, round, and reactive to light. EOM are normal.  Neck: Normal range of motion. Neck supple.  Cardiovascular: Normal rate and regular rhythm.  Pulmonary/Chest: No respiratory distress. She has wheezes. She has no rales.  Exp wheeze t/o. Breathless when speaking.   Musculoskeletal: Normal range of motion.  Neurological: She is alert and oriented to person, place, and time.  Skin: Skin is warm and dry.  Psychiatric:  Anxious and tremulous      Lab Results:  CBC    Component Value Date/Time   WBC 16.3 (H) 10/02/2017 1710   RBC 4.84 10/02/2017 1710   HGB 14.5 10/02/2017 1710   HCT 43.8 10/02/2017 1710   PLT 516.0  (H) 10/02/2017 1710   MCV 90.4 10/02/2017 1710   MCH 30.2 08/19/2011 0640   MCHC 33.1 10/02/2017 1710   RDW 14.9 10/02/2017 1710   LYMPHSABS 2.5 10/02/2017 1710   MONOABS 0.7 10/02/2017 1710   EOSABS 0.1 10/02/2017 1710   BASOSABS 0.1 10/02/2017 1710    BMET    Component Value Date/Time   NA 142 08/19/2011 0640   K 3.8 08/19/2011 0640   CL 107 08/19/2011 0640   CO2 23 08/19/2011 0640   GLUCOSE 91 08/19/2011 0640   BUN 16 08/19/2011 0640   CREATININE 0.82 08/19/2011 0640   CALCIUM 9.2 08/19/2011 0640   GFRNONAA 79 (L) 08/19/2011 0640   GFRAA >90 08/19/2011 [id number]  BNP No results found for: BNP  ProBNP No results found for: PROBNP  Imaging: No results found.   Assessment & Plan:   Mold exposure - Mold allergy panel negative  - Start Singulair and Zyrtec   Dyspnea on exertion - Complains of sob on exertion, wheezing in morning - Diffuse wheezes t/o lung fields, breathless when speaking  - Multifactorial, anxiety and physical deconditioning likely playing a large part in dyspnea symptoms - PFTs completed today showing no restriction or obstruction - WBC elevated, checking CXR  - 1 month trial ICS- Asmanax 2 puffs twice a day  Snoring - ONO showed O2 low 79%. Spent 8.6 consecutive minutes under 88%.  - Not wearing oxygen at night - Reports loud snoring, gasping for breath throughout the night and freq arm movement - Recommend split night sleep study, likely has undiagnosed OSA   Asthma due to environmental allergies - Allergy panel positive for dust mites and dog dander  - IgE 27 elevated, normal eosinophils. Could consider Xolair in the future if respiratory symptoms worsen  - Recommend dust mite cover for bedding, cleaning carpets regularly  - Start singulair and zyrtec    Physical deconditioning - Recommended physical therapy      Glenford Bayley, NP 10/04/2017

## 2017-10-02 NOTE — Patient Instructions (Addendum)
Please wear 2L oxygen at night  Needs split night sleep study re: snoring   1 month trial inhaled corticosteroid - Asmanax 2 puffs twice a day  Allergy testing today   Start Singulair, continue zyrtec daily   Recommend physical therapy for general deconditioning   FU with Dr. Marchelle Gearingamaswamy in 4 weeks please

## 2017-10-02 NOTE — Progress Notes (Signed)
PFT completed today. 10/02/17  

## 2017-10-03 LAB — RESPIRATORY ALLERGY PROFILE REGION II ~~LOC~~
Allergen, Cedar tree, t12: <0.1 kU/L
Allergen, Comm Silver Birch, t9: <0.1 kU/L
Allergen, D pternoyssinus,d7: 4.37 kU/L — ABNORMAL HIGH
Allergen, Mouse Urine Protein, e78: <0.1 kU/L
Allergen, Mulberry, t76: <0.1 kU/L
Allergen, Oak,t7: <0.1 kU/L
Aspergillus fumigatus, m3: <0.1 kU/L
CLASS: 0
CLASS: 0
CLASS: 0
CLASS: 0
CLASS: 0
CLASS: 0
CLASS: 0
CLASS: 0
CLASS: 0
CLASS: 0
CLASS: 0
CLASS: 0
CLASS: 0
CLASS: 3
Cat Dander: <0.1 kU/L
Class: 0
Class: 0
Class: 0
Class: 0
Class: 0
Class: 0
Class: 0
Class: 0
Class: 0
Class: 3
Cockroach: <0.1 kU/L
D. farinae: 3.67 kU/L — ABNORMAL HIGH
DOG DANDER: 0.22 kU/L — AB
IGE (IMMUNOGLOBULIN E), SERUM: 26 kU/L (ref <=114)
Pecan/Hickory Tree IgE: <0.1 kU/L
Rough Pigweed  IgE: <0.1 kU/L
Timothy Grass: <0.1 kU/L

## 2017-10-03 LAB — INTERPRETATION:

## 2017-10-03 LAB — ALLERGY PANEL 11, MOLD GROUP
Allergen, Mucor Racemosus, M4: <0.1 kU/L
Aspergillus fumigatus, m3: <0.1 kU/L
CLASS: 0
CLASS: 0
CLASS: 0
CLASS: 0
Candida Albicans: <0.1 kU/L
Class: 0

## 2017-10-04 ENCOUNTER — Other Ambulatory Visit: Payer: Self-pay

## 2017-10-04 ENCOUNTER — Encounter: Payer: Self-pay | Admitting: Primary Care

## 2017-10-04 DIAGNOSIS — D729 Disorder of white blood cells, unspecified: Secondary | ICD-10-CM | POA: Insufficient documentation

## 2017-10-04 DIAGNOSIS — R0609 Other forms of dyspnea: Principal | ICD-10-CM

## 2017-10-04 DIAGNOSIS — R5381 Other malaise: Secondary | ICD-10-CM | POA: Insufficient documentation

## 2017-10-04 DIAGNOSIS — Z77120 Contact with and (suspected) exposure to mold (toxic): Secondary | ICD-10-CM | POA: Insufficient documentation

## 2017-10-04 DIAGNOSIS — J45909 Unspecified asthma, uncomplicated: Secondary | ICD-10-CM | POA: Insufficient documentation

## 2017-10-04 DIAGNOSIS — R0683 Snoring: Secondary | ICD-10-CM | POA: Insufficient documentation

## 2017-10-04 NOTE — Assessment & Plan Note (Signed)
-   Recommended physical therapy

## 2017-10-04 NOTE — Assessment & Plan Note (Addendum)
-   Mold allergy panel negative  - Start Singulair and Zyrtec

## 2017-10-04 NOTE — Assessment & Plan Note (Addendum)
-   ONO showed O2 low 79%. Spent 8.6 consecutive minutes under 88%.  - Not wearing oxygen at night - Reports loud snoring, gasping for breath throughout the night and freq arm movement - Recommend split night sleep study, likely has undiagnosed OSA

## 2017-10-04 NOTE — Assessment & Plan Note (Signed)
-   Allergy panel positive for dust mites and dog dander  - IgE 27 elevated, normal eosinophils. Could consider Xolair in the future if respiratory symptoms worsen  - Recommend dust mite cover for bedding, cleaning carpets regularly  - Start singulair and zyrtec

## 2017-10-04 NOTE — Assessment & Plan Note (Addendum)
-   Complains of sob on exertion, wheezing in morning - Diffuse wheezes t/o lung fields, breathless when speaking  - Multifactorial, anxiety and physical deconditioning likely playing a large part in dyspnea symptoms - PFTs completed today showing no restriction or obstruction - WBC elevated, checking CXR  - 1 month trial ICS- Asmanax 2 puffs twice a day

## 2017-10-05 ENCOUNTER — Telehealth: Payer: Self-pay | Admitting: Primary Care

## 2017-10-05 NOTE — Telephone Encounter (Signed)
Spoke with patient-states she did not get any sleep and feels unable to drive/function today. Therefore she will come by the office Monday to have tests completed. Nothing more needed at this time.

## 2017-10-19 DIAGNOSIS — J45901 Unspecified asthma with (acute) exacerbation: Secondary | ICD-10-CM | POA: Diagnosis not present

## 2017-10-25 ENCOUNTER — Telehealth: Payer: Self-pay | Admitting: Internal Medicine

## 2017-10-25 DIAGNOSIS — R0683 Snoring: Secondary | ICD-10-CM

## 2017-10-25 NOTE — Telephone Encounter (Signed)
Stated at end of call with need signature also. [name] will be faxing this over now.

## 2017-10-25 NOTE — Telephone Encounter (Signed)
Attempted to call Connie Jackson. I did not receive an answer. I have left a message for [name] to return our call.

## 2017-10-26 NOTE — Telephone Encounter (Signed)
Beth please advise of dx for over night pulse oximetry. Thanks.

## 2017-10-26 NOTE — Telephone Encounter (Signed)
Patient has a split night scheduled for 11/05/2017 at 2000.    I think message was from ONO Per Emiliy ONO done on 06/20/2017.Calling for correct diagnosis code per [name] to file claim for patient for the ONO, Not the split night study. Dx code Z87.01 is not an approved dx code to file claim.  [name] is going to fax over the approved dx codes for procedure and once we have the correct one, we will call [name] back so he can put in the correct dx code to file claim for patient  Fax received and placed on MR workspace. Let MR know routing message   Route to MR  Route to [place] as Lorain Childes

## 2017-10-26 NOTE — Telephone Encounter (Signed)
Last seen by Ames Dura in sept 2019 and split night sleep study ordered. Please check with her if ONO is also needed bcuase per records I had done one in June and was abnormal

## 2017-10-26 NOTE — Telephone Encounter (Signed)
MR, please advise on diagnosis code that they can use for the overnight oxygen test that had been performed 06/21/17.  Thank!

## 2017-10-26 NOTE — Telephone Encounter (Signed)
No I just need split night sleep study

## 2017-10-31 NOTE — Telephone Encounter (Signed)
I have received the form from MR with the change in diagnosis code and have faxed it to [employer].  Received a confirmation stating the fax went through. Nothing further needed.

## 2017-10-31 NOTE — Telephone Encounter (Signed)
Gave dyspnea and wheezing and is on my desk - on top of the blue cabinet

## 2017-10-31 NOTE — Telephone Encounter (Signed)
Please advise of Dx codes, Thanks.

## 2017-11-05 ENCOUNTER — Ambulatory Visit (HOSPITAL_BASED_OUTPATIENT_CLINIC_OR_DEPARTMENT_OTHER): Payer: Medicare Other | Attending: Primary Care

## 2017-11-19 DIAGNOSIS — J45901 Unspecified asthma with (acute) exacerbation: Secondary | ICD-10-CM | POA: Diagnosis not present

## 2017-12-19 DIAGNOSIS — J45901 Unspecified asthma with (acute) exacerbation: Secondary | ICD-10-CM | POA: Diagnosis not present

## 2018-01-19 DIAGNOSIS — J45901 Unspecified asthma with (acute) exacerbation: Secondary | ICD-10-CM | POA: Diagnosis not present

## 2018-01-23 DIAGNOSIS — J019 Acute sinusitis, unspecified: Secondary | ICD-10-CM | POA: Diagnosis not present

## 2018-01-23 DIAGNOSIS — R6889 Other general symptoms and signs: Secondary | ICD-10-CM | POA: Diagnosis not present

## 2018-02-19 DIAGNOSIS — J208 Acute bronchitis due to other specified organisms: Secondary | ICD-10-CM | POA: Diagnosis not present

## 2018-02-19 DIAGNOSIS — J45901 Unspecified asthma with (acute) exacerbation: Secondary | ICD-10-CM | POA: Diagnosis not present

## 2018-03-20 DIAGNOSIS — J45901 Unspecified asthma with (acute) exacerbation: Secondary | ICD-10-CM | POA: Diagnosis not present

## 2018-04-10 DIAGNOSIS — Z1211 Encounter for screening for malignant neoplasm of colon: Secondary | ICD-10-CM | POA: Diagnosis not present

## 2018-04-10 DIAGNOSIS — Z0000 Encounter for general adult medical examination without abnormal findings: Secondary | ICD-10-CM | POA: Diagnosis not present

## 2018-04-10 DIAGNOSIS — Z1231 Encounter for screening mammogram for malignant neoplasm of breast: Secondary | ICD-10-CM | POA: Diagnosis not present

## 2018-04-10 DIAGNOSIS — Z9181 History of falling: Secondary | ICD-10-CM | POA: Diagnosis not present

## 2018-04-10 DIAGNOSIS — Z136 Encounter for screening for cardiovascular disorders: Secondary | ICD-10-CM | POA: Diagnosis not present

## 2018-04-10 DIAGNOSIS — E785 Hyperlipidemia, unspecified: Secondary | ICD-10-CM | POA: Diagnosis not present

## 2018-04-20 DIAGNOSIS — J45901 Unspecified asthma with (acute) exacerbation: Secondary | ICD-10-CM | POA: Diagnosis not present

## 2018-05-20 DIAGNOSIS — J45901 Unspecified asthma with (acute) exacerbation: Secondary | ICD-10-CM | POA: Diagnosis not present

## 2018-06-20 DIAGNOSIS — J45901 Unspecified asthma with (acute) exacerbation: Secondary | ICD-10-CM | POA: Diagnosis not present

## 2018-07-20 DIAGNOSIS — J45901 Unspecified asthma with (acute) exacerbation: Secondary | ICD-10-CM | POA: Diagnosis not present

## 2018-08-20 DIAGNOSIS — J45901 Unspecified asthma with (acute) exacerbation: Secondary | ICD-10-CM | POA: Diagnosis not present

## 2018-09-20 DIAGNOSIS — J45901 Unspecified asthma with (acute) exacerbation: Secondary | ICD-10-CM | POA: Diagnosis not present

## 2018-09-24 ENCOUNTER — Other Ambulatory Visit: Payer: Self-pay | Admitting: Primary Care

## 2018-10-20 DIAGNOSIS — J45901 Unspecified asthma with (acute) exacerbation: Secondary | ICD-10-CM | POA: Diagnosis not present

## 2018-11-20 DIAGNOSIS — J45901 Unspecified asthma with (acute) exacerbation: Secondary | ICD-10-CM | POA: Diagnosis not present

## 2018-11-20 DIAGNOSIS — Z1231 Encounter for screening mammogram for malignant neoplasm of breast: Secondary | ICD-10-CM | POA: Diagnosis not present

## 2018-11-20 DIAGNOSIS — H8113 Benign paroxysmal vertigo, bilateral: Secondary | ICD-10-CM | POA: Diagnosis not present

## 2018-12-20 DIAGNOSIS — J45901 Unspecified asthma with (acute) exacerbation: Secondary | ICD-10-CM | POA: Diagnosis not present

## 2019-01-20 DIAGNOSIS — J45901 Unspecified asthma with (acute) exacerbation: Secondary | ICD-10-CM | POA: Diagnosis not present

## 2019-01-22 DIAGNOSIS — M797 Fibromyalgia: Secondary | ICD-10-CM | POA: Diagnosis not present

## 2019-01-22 DIAGNOSIS — E785 Hyperlipidemia, unspecified: Secondary | ICD-10-CM | POA: Diagnosis not present

## 2019-01-22 DIAGNOSIS — I480 Paroxysmal atrial fibrillation: Secondary | ICD-10-CM | POA: Diagnosis not present

## 2019-02-20 DIAGNOSIS — J45901 Unspecified asthma with (acute) exacerbation: Secondary | ICD-10-CM | POA: Diagnosis not present

## 2019-03-20 DIAGNOSIS — J45901 Unspecified asthma with (acute) exacerbation: Secondary | ICD-10-CM | POA: Diagnosis not present

## 2019-04-20 DIAGNOSIS — J45901 Unspecified asthma with (acute) exacerbation: Secondary | ICD-10-CM | POA: Diagnosis not present

## 2019-05-20 DIAGNOSIS — J45901 Unspecified asthma with (acute) exacerbation: Secondary | ICD-10-CM | POA: Diagnosis not present

## 2019-06-20 DIAGNOSIS — J45901 Unspecified asthma with (acute) exacerbation: Secondary | ICD-10-CM | POA: Diagnosis not present

## 2019-07-20 DIAGNOSIS — J45901 Unspecified asthma with (acute) exacerbation: Secondary | ICD-10-CM | POA: Diagnosis not present

## 2019-07-23 DIAGNOSIS — B9689 Other specified bacterial agents as the cause of diseases classified elsewhere: Secondary | ICD-10-CM | POA: Diagnosis not present

## 2019-07-23 DIAGNOSIS — J0100 Acute maxillary sinusitis, unspecified: Secondary | ICD-10-CM | POA: Diagnosis not present

## 2019-07-23 DIAGNOSIS — R519 Headache, unspecified: Secondary | ICD-10-CM | POA: Diagnosis not present

## 2019-07-23 DIAGNOSIS — G43909 Migraine, unspecified, not intractable, without status migrainosus: Secondary | ICD-10-CM | POA: Diagnosis not present

## 2019-08-15 DIAGNOSIS — Z79899 Other long term (current) drug therapy: Secondary | ICD-10-CM | POA: Diagnosis not present

## 2019-08-15 DIAGNOSIS — E785 Hyperlipidemia, unspecified: Secondary | ICD-10-CM | POA: Diagnosis not present

## 2019-08-15 DIAGNOSIS — R5382 Chronic fatigue, unspecified: Secondary | ICD-10-CM | POA: Diagnosis not present

## 2019-08-15 DIAGNOSIS — E538 Deficiency of other specified B group vitamins: Secondary | ICD-10-CM | POA: Diagnosis not present

## 2019-08-15 DIAGNOSIS — I480 Paroxysmal atrial fibrillation: Secondary | ICD-10-CM | POA: Diagnosis not present

## 2019-08-15 DIAGNOSIS — M797 Fibromyalgia: Secondary | ICD-10-CM | POA: Diagnosis not present

## 2019-08-20 DIAGNOSIS — J45901 Unspecified asthma with (acute) exacerbation: Secondary | ICD-10-CM | POA: Diagnosis not present

## 2019-09-20 DIAGNOSIS — J45901 Unspecified asthma with (acute) exacerbation: Secondary | ICD-10-CM | POA: Diagnosis not present

## 2019-09-30 DIAGNOSIS — R069 Unspecified abnormalities of breathing: Secondary | ICD-10-CM | POA: Diagnosis not present

## 2019-09-30 DIAGNOSIS — Z743 Need for continuous supervision: Secondary | ICD-10-CM | POA: Diagnosis not present

## 2019-09-30 DIAGNOSIS — R112 Nausea with vomiting, unspecified: Secondary | ICD-10-CM | POA: Diagnosis not present

## 2019-09-30 DIAGNOSIS — R6889 Other general symptoms and signs: Secondary | ICD-10-CM | POA: Diagnosis not present

## 2019-09-30 DIAGNOSIS — R0602 Shortness of breath: Secondary | ICD-10-CM | POA: Diagnosis not present

## 2019-10-20 DIAGNOSIS — J45901 Unspecified asthma with (acute) exacerbation: Secondary | ICD-10-CM | POA: Diagnosis not present

## 2019-11-20 DIAGNOSIS — J45901 Unspecified asthma with (acute) exacerbation: Secondary | ICD-10-CM | POA: Diagnosis not present

## 2019-12-20 DIAGNOSIS — J45901 Unspecified asthma with (acute) exacerbation: Secondary | ICD-10-CM | POA: Diagnosis not present

## 2020-01-20 DIAGNOSIS — J45901 Unspecified asthma with (acute) exacerbation: Secondary | ICD-10-CM | POA: Diagnosis not present

## 2020-02-11 DIAGNOSIS — B372 Candidiasis of skin and nail: Secondary | ICD-10-CM | POA: Diagnosis not present

## 2020-02-11 DIAGNOSIS — D519 Vitamin B12 deficiency anemia, unspecified: Secondary | ICD-10-CM | POA: Diagnosis not present

## 2020-02-11 DIAGNOSIS — E785 Hyperlipidemia, unspecified: Secondary | ICD-10-CM | POA: Diagnosis not present

## 2020-02-11 DIAGNOSIS — M797 Fibromyalgia: Secondary | ICD-10-CM | POA: Diagnosis not present

## 2020-02-11 DIAGNOSIS — I480 Paroxysmal atrial fibrillation: Secondary | ICD-10-CM | POA: Diagnosis not present

## 2020-02-20 DIAGNOSIS — J45901 Unspecified asthma with (acute) exacerbation: Secondary | ICD-10-CM | POA: Diagnosis not present

## 2020-03-19 DIAGNOSIS — J45901 Unspecified asthma with (acute) exacerbation: Secondary | ICD-10-CM | POA: Diagnosis not present

## 2020-04-19 DIAGNOSIS — J45901 Unspecified asthma with (acute) exacerbation: Secondary | ICD-10-CM | POA: Diagnosis not present

## 2020-05-19 DIAGNOSIS — J45901 Unspecified asthma with (acute) exacerbation: Secondary | ICD-10-CM | POA: Diagnosis not present

## 2020-06-15 DIAGNOSIS — M545 Low back pain, unspecified: Secondary | ICD-10-CM | POA: Diagnosis not present

## 2020-06-15 DIAGNOSIS — G8911 Acute pain due to trauma: Secondary | ICD-10-CM | POA: Diagnosis not present

## 2020-06-15 DIAGNOSIS — N390 Urinary tract infection, site not specified: Secondary | ICD-10-CM | POA: Diagnosis not present

## 2020-06-15 DIAGNOSIS — R296 Repeated falls: Secondary | ICD-10-CM | POA: Diagnosis not present

## 2020-06-15 DIAGNOSIS — M542 Cervicalgia: Secondary | ICD-10-CM | POA: Diagnosis not present

## 2020-06-19 DIAGNOSIS — J45901 Unspecified asthma with (acute) exacerbation: Secondary | ICD-10-CM | POA: Diagnosis not present

## 2020-07-19 DIAGNOSIS — J45901 Unspecified asthma with (acute) exacerbation: Secondary | ICD-10-CM | POA: Diagnosis not present

## 2020-08-06 DIAGNOSIS — D519 Vitamin B12 deficiency anemia, unspecified: Secondary | ICD-10-CM | POA: Diagnosis not present

## 2020-08-06 DIAGNOSIS — M797 Fibromyalgia: Secondary | ICD-10-CM | POA: Diagnosis not present

## 2020-08-06 DIAGNOSIS — E785 Hyperlipidemia, unspecified: Secondary | ICD-10-CM | POA: Diagnosis not present

## 2020-08-06 DIAGNOSIS — I480 Paroxysmal atrial fibrillation: Secondary | ICD-10-CM | POA: Diagnosis not present

## 2020-08-19 DIAGNOSIS — L7211 Pilar cyst: Secondary | ICD-10-CM | POA: Diagnosis not present

## 2020-08-19 DIAGNOSIS — D519 Vitamin B12 deficiency anemia, unspecified: Secondary | ICD-10-CM | POA: Diagnosis not present

## 2020-08-19 DIAGNOSIS — J45901 Unspecified asthma with (acute) exacerbation: Secondary | ICD-10-CM | POA: Diagnosis not present

## 2020-08-19 DIAGNOSIS — E785 Hyperlipidemia, unspecified: Secondary | ICD-10-CM | POA: Diagnosis not present

## 2020-08-19 DIAGNOSIS — N390 Urinary tract infection, site not specified: Secondary | ICD-10-CM | POA: Diagnosis not present

## 2020-08-31 DIAGNOSIS — Z87448 Personal history of other diseases of urinary system: Secondary | ICD-10-CM | POA: Diagnosis not present

## 2020-08-31 DIAGNOSIS — Z87442 Personal history of urinary calculi: Secondary | ICD-10-CM | POA: Diagnosis not present

## 2020-08-31 DIAGNOSIS — Z79899 Other long term (current) drug therapy: Secondary | ICD-10-CM | POA: Diagnosis not present

## 2020-08-31 DIAGNOSIS — R1032 Left lower quadrant pain: Secondary | ICD-10-CM | POA: Diagnosis not present

## 2020-08-31 DIAGNOSIS — N390 Urinary tract infection, site not specified: Secondary | ICD-10-CM | POA: Diagnosis not present

## 2020-08-31 DIAGNOSIS — R1031 Right lower quadrant pain: Secondary | ICD-10-CM | POA: Diagnosis not present

## 2020-09-19 DIAGNOSIS — J45901 Unspecified asthma with (acute) exacerbation: Secondary | ICD-10-CM | POA: Diagnosis not present

## 2020-10-19 DIAGNOSIS — J45901 Unspecified asthma with (acute) exacerbation: Secondary | ICD-10-CM | POA: Diagnosis not present

## 2020-11-03 DIAGNOSIS — E785 Hyperlipidemia, unspecified: Secondary | ICD-10-CM | POA: Diagnosis not present

## 2020-11-03 DIAGNOSIS — Z9181 History of falling: Secondary | ICD-10-CM | POA: Diagnosis not present

## 2020-11-03 DIAGNOSIS — Z0000 Encounter for general adult medical examination without abnormal findings: Secondary | ICD-10-CM | POA: Diagnosis not present

## 2020-11-19 DIAGNOSIS — J45901 Unspecified asthma with (acute) exacerbation: Secondary | ICD-10-CM | POA: Diagnosis not present

## 2020-11-24 DIAGNOSIS — D519 Vitamin B12 deficiency anemia, unspecified: Secondary | ICD-10-CM | POA: Diagnosis not present

## 2020-11-24 DIAGNOSIS — E785 Hyperlipidemia, unspecified: Secondary | ICD-10-CM | POA: Diagnosis not present

## 2020-11-24 DIAGNOSIS — M797 Fibromyalgia: Secondary | ICD-10-CM | POA: Diagnosis not present

## 2020-11-24 DIAGNOSIS — I480 Paroxysmal atrial fibrillation: Secondary | ICD-10-CM | POA: Diagnosis not present

## 2020-12-16 DIAGNOSIS — Z79899 Other long term (current) drug therapy: Secondary | ICD-10-CM | POA: Diagnosis not present

## 2020-12-16 DIAGNOSIS — J040 Acute laryngitis: Secondary | ICD-10-CM | POA: Diagnosis not present

## 2020-12-16 DIAGNOSIS — J45909 Unspecified asthma, uncomplicated: Secondary | ICD-10-CM | POA: Diagnosis not present

## 2020-12-19 DIAGNOSIS — J45901 Unspecified asthma with (acute) exacerbation: Secondary | ICD-10-CM | POA: Diagnosis not present

## 2021-01-19 DIAGNOSIS — J45901 Unspecified asthma with (acute) exacerbation: Secondary | ICD-10-CM | POA: Diagnosis not present

## 2021-02-10 DIAGNOSIS — R6889 Other general symptoms and signs: Secondary | ICD-10-CM | POA: Diagnosis not present

## 2021-02-10 DIAGNOSIS — Z888 Allergy status to other drugs, medicaments and biological substances status: Secondary | ICD-10-CM | POA: Diagnosis not present

## 2021-02-10 DIAGNOSIS — Z882 Allergy status to sulfonamides status: Secondary | ICD-10-CM | POA: Diagnosis not present

## 2021-02-10 DIAGNOSIS — R109 Unspecified abdominal pain: Secondary | ICD-10-CM | POA: Diagnosis not present

## 2021-02-10 DIAGNOSIS — N200 Calculus of kidney: Secondary | ICD-10-CM | POA: Diagnosis not present

## 2021-02-10 DIAGNOSIS — Z9981 Dependence on supplemental oxygen: Secondary | ICD-10-CM | POA: Diagnosis not present

## 2021-02-10 DIAGNOSIS — E785 Hyperlipidemia, unspecified: Secondary | ICD-10-CM | POA: Diagnosis not present

## 2021-02-10 DIAGNOSIS — R410 Disorientation, unspecified: Secondary | ICD-10-CM | POA: Diagnosis not present

## 2021-02-10 DIAGNOSIS — R6520 Severe sepsis without septic shock: Secondary | ICD-10-CM | POA: Diagnosis not present

## 2021-02-10 DIAGNOSIS — A419 Sepsis, unspecified organism: Secondary | ICD-10-CM | POA: Diagnosis not present

## 2021-02-10 DIAGNOSIS — K5730 Diverticulosis of large intestine without perforation or abscess without bleeding: Secondary | ICD-10-CM | POA: Diagnosis not present

## 2021-02-10 DIAGNOSIS — N12 Tubulo-interstitial nephritis, not specified as acute or chronic: Secondary | ICD-10-CM | POA: Diagnosis not present

## 2021-02-10 DIAGNOSIS — R059 Cough, unspecified: Secondary | ICD-10-CM | POA: Diagnosis not present

## 2021-02-10 DIAGNOSIS — I959 Hypotension, unspecified: Secondary | ICD-10-CM | POA: Diagnosis not present

## 2021-02-10 DIAGNOSIS — R509 Fever, unspecified: Secondary | ICD-10-CM | POA: Diagnosis not present

## 2021-02-10 DIAGNOSIS — I4510 Unspecified right bundle-branch block: Secondary | ICD-10-CM | POA: Diagnosis not present

## 2021-02-10 DIAGNOSIS — Z743 Need for continuous supervision: Secondary | ICD-10-CM | POA: Diagnosis not present

## 2021-02-10 DIAGNOSIS — Z7901 Long term (current) use of anticoagulants: Secondary | ICD-10-CM | POA: Diagnosis not present

## 2021-02-10 DIAGNOSIS — K760 Fatty (change of) liver, not elsewhere classified: Secondary | ICD-10-CM | POA: Diagnosis not present

## 2021-02-10 DIAGNOSIS — J9601 Acute respiratory failure with hypoxia: Secondary | ICD-10-CM | POA: Diagnosis not present

## 2021-02-10 DIAGNOSIS — I482 Chronic atrial fibrillation, unspecified: Secondary | ICD-10-CM | POA: Diagnosis not present

## 2021-02-10 DIAGNOSIS — Z20822 Contact with and (suspected) exposure to covid-19: Secondary | ICD-10-CM | POA: Diagnosis not present

## 2021-02-10 DIAGNOSIS — N390 Urinary tract infection, site not specified: Secondary | ICD-10-CM | POA: Diagnosis not present

## 2021-02-10 DIAGNOSIS — Z79899 Other long term (current) drug therapy: Secondary | ICD-10-CM | POA: Diagnosis not present

## 2021-02-10 DIAGNOSIS — R0902 Hypoxemia: Secondary | ICD-10-CM | POA: Diagnosis not present

## 2021-02-10 DIAGNOSIS — F32A Depression, unspecified: Secondary | ICD-10-CM | POA: Diagnosis not present

## 2021-02-10 DIAGNOSIS — M797 Fibromyalgia: Secondary | ICD-10-CM | POA: Diagnosis not present

## 2021-02-10 DIAGNOSIS — E7800 Pure hypercholesterolemia, unspecified: Secondary | ICD-10-CM | POA: Diagnosis not present

## 2021-02-10 DIAGNOSIS — Z880 Allergy status to penicillin: Secondary | ICD-10-CM | POA: Diagnosis not present

## 2021-02-11 DIAGNOSIS — J9601 Acute respiratory failure with hypoxia: Secondary | ICD-10-CM | POA: Diagnosis not present

## 2021-02-11 DIAGNOSIS — N390 Urinary tract infection, site not specified: Secondary | ICD-10-CM | POA: Diagnosis not present

## 2021-02-11 DIAGNOSIS — E785 Hyperlipidemia, unspecified: Secondary | ICD-10-CM | POA: Diagnosis not present

## 2021-02-11 DIAGNOSIS — I959 Hypotension, unspecified: Secondary | ICD-10-CM | POA: Diagnosis not present

## 2021-02-11 DIAGNOSIS — Z7901 Long term (current) use of anticoagulants: Secondary | ICD-10-CM | POA: Diagnosis not present

## 2021-02-11 DIAGNOSIS — I482 Chronic atrial fibrillation, unspecified: Secondary | ICD-10-CM | POA: Diagnosis not present

## 2021-02-11 DIAGNOSIS — A419 Sepsis, unspecified organism: Secondary | ICD-10-CM | POA: Diagnosis not present

## 2021-02-11 DIAGNOSIS — M797 Fibromyalgia: Secondary | ICD-10-CM | POA: Diagnosis not present

## 2021-02-11 DIAGNOSIS — Z9981 Dependence on supplemental oxygen: Secondary | ICD-10-CM | POA: Diagnosis not present

## 2021-02-11 DIAGNOSIS — I4510 Unspecified right bundle-branch block: Secondary | ICD-10-CM | POA: Diagnosis not present

## 2021-02-12 DIAGNOSIS — N390 Urinary tract infection, site not specified: Secondary | ICD-10-CM | POA: Diagnosis not present

## 2021-02-12 DIAGNOSIS — I482 Chronic atrial fibrillation, unspecified: Secondary | ICD-10-CM | POA: Diagnosis not present

## 2021-02-12 DIAGNOSIS — Z9981 Dependence on supplemental oxygen: Secondary | ICD-10-CM | POA: Diagnosis not present

## 2021-02-12 DIAGNOSIS — M797 Fibromyalgia: Secondary | ICD-10-CM | POA: Diagnosis not present

## 2021-02-12 DIAGNOSIS — I959 Hypotension, unspecified: Secondary | ICD-10-CM | POA: Diagnosis not present

## 2021-02-12 DIAGNOSIS — J9601 Acute respiratory failure with hypoxia: Secondary | ICD-10-CM | POA: Diagnosis not present

## 2021-02-12 DIAGNOSIS — E785 Hyperlipidemia, unspecified: Secondary | ICD-10-CM | POA: Diagnosis not present

## 2021-02-12 DIAGNOSIS — Z7901 Long term (current) use of anticoagulants: Secondary | ICD-10-CM | POA: Diagnosis not present

## 2021-02-12 DIAGNOSIS — A419 Sepsis, unspecified organism: Secondary | ICD-10-CM | POA: Diagnosis not present

## 2021-02-16 DIAGNOSIS — M797 Fibromyalgia: Secondary | ICD-10-CM | POA: Diagnosis not present

## 2021-02-16 DIAGNOSIS — N390 Urinary tract infection, site not specified: Secondary | ICD-10-CM | POA: Diagnosis not present

## 2021-02-16 DIAGNOSIS — D519 Vitamin B12 deficiency anemia, unspecified: Secondary | ICD-10-CM | POA: Diagnosis not present

## 2021-02-16 DIAGNOSIS — J9601 Acute respiratory failure with hypoxia: Secondary | ICD-10-CM | POA: Diagnosis not present

## 2021-02-16 DIAGNOSIS — R6520 Severe sepsis without septic shock: Secondary | ICD-10-CM | POA: Diagnosis not present

## 2021-02-16 DIAGNOSIS — A419 Sepsis, unspecified organism: Secondary | ICD-10-CM | POA: Diagnosis not present

## 2021-02-16 DIAGNOSIS — I480 Paroxysmal atrial fibrillation: Secondary | ICD-10-CM | POA: Diagnosis not present

## 2021-02-16 DIAGNOSIS — E785 Hyperlipidemia, unspecified: Secondary | ICD-10-CM | POA: Diagnosis not present

## 2021-02-16 DIAGNOSIS — I959 Hypotension, unspecified: Secondary | ICD-10-CM | POA: Diagnosis not present

## 2021-02-17 DIAGNOSIS — M797 Fibromyalgia: Secondary | ICD-10-CM | POA: Diagnosis not present

## 2021-02-17 DIAGNOSIS — N390 Urinary tract infection, site not specified: Secondary | ICD-10-CM | POA: Diagnosis not present

## 2021-02-17 DIAGNOSIS — B9689 Other specified bacterial agents as the cause of diseases classified elsewhere: Secondary | ICD-10-CM | POA: Diagnosis not present

## 2021-02-17 DIAGNOSIS — E785 Hyperlipidemia, unspecified: Secondary | ICD-10-CM | POA: Diagnosis not present

## 2021-02-17 DIAGNOSIS — Z7901 Long term (current) use of anticoagulants: Secondary | ICD-10-CM | POA: Diagnosis not present

## 2021-02-17 DIAGNOSIS — B9562 Methicillin resistant Staphylococcus aureus infection as the cause of diseases classified elsewhere: Secondary | ICD-10-CM | POA: Diagnosis not present

## 2021-02-17 DIAGNOSIS — Z792 Long term (current) use of antibiotics: Secondary | ICD-10-CM | POA: Diagnosis not present

## 2021-02-17 DIAGNOSIS — I482 Chronic atrial fibrillation, unspecified: Secondary | ICD-10-CM | POA: Diagnosis not present

## 2021-02-19 DIAGNOSIS — J45901 Unspecified asthma with (acute) exacerbation: Secondary | ICD-10-CM | POA: Diagnosis not present

## 2021-02-24 DIAGNOSIS — I482 Chronic atrial fibrillation, unspecified: Secondary | ICD-10-CM | POA: Diagnosis not present

## 2021-02-24 DIAGNOSIS — B9689 Other specified bacterial agents as the cause of diseases classified elsewhere: Secondary | ICD-10-CM | POA: Diagnosis not present

## 2021-02-24 DIAGNOSIS — Z792 Long term (current) use of antibiotics: Secondary | ICD-10-CM | POA: Diagnosis not present

## 2021-02-24 DIAGNOSIS — N390 Urinary tract infection, site not specified: Secondary | ICD-10-CM | POA: Diagnosis not present

## 2021-02-24 DIAGNOSIS — E785 Hyperlipidemia, unspecified: Secondary | ICD-10-CM | POA: Diagnosis not present

## 2021-02-24 DIAGNOSIS — M797 Fibromyalgia: Secondary | ICD-10-CM | POA: Diagnosis not present

## 2021-02-24 DIAGNOSIS — Z7901 Long term (current) use of anticoagulants: Secondary | ICD-10-CM | POA: Diagnosis not present

## 2021-02-24 DIAGNOSIS — B9562 Methicillin resistant Staphylococcus aureus infection as the cause of diseases classified elsewhere: Secondary | ICD-10-CM | POA: Diagnosis not present

## 2021-02-25 DIAGNOSIS — I482 Chronic atrial fibrillation, unspecified: Secondary | ICD-10-CM | POA: Diagnosis not present

## 2021-02-25 DIAGNOSIS — E785 Hyperlipidemia, unspecified: Secondary | ICD-10-CM | POA: Diagnosis not present

## 2021-02-25 DIAGNOSIS — B9689 Other specified bacterial agents as the cause of diseases classified elsewhere: Secondary | ICD-10-CM | POA: Diagnosis not present

## 2021-02-25 DIAGNOSIS — M797 Fibromyalgia: Secondary | ICD-10-CM | POA: Diagnosis not present

## 2021-02-25 DIAGNOSIS — Z792 Long term (current) use of antibiotics: Secondary | ICD-10-CM | POA: Diagnosis not present

## 2021-02-25 DIAGNOSIS — N390 Urinary tract infection, site not specified: Secondary | ICD-10-CM | POA: Diagnosis not present

## 2021-02-25 DIAGNOSIS — B9562 Methicillin resistant Staphylococcus aureus infection as the cause of diseases classified elsewhere: Secondary | ICD-10-CM | POA: Diagnosis not present

## 2021-02-25 DIAGNOSIS — Z7901 Long term (current) use of anticoagulants: Secondary | ICD-10-CM | POA: Diagnosis not present

## 2021-03-01 DIAGNOSIS — Z7901 Long term (current) use of anticoagulants: Secondary | ICD-10-CM | POA: Diagnosis not present

## 2021-03-01 DIAGNOSIS — Z792 Long term (current) use of antibiotics: Secondary | ICD-10-CM | POA: Diagnosis not present

## 2021-03-01 DIAGNOSIS — B9562 Methicillin resistant Staphylococcus aureus infection as the cause of diseases classified elsewhere: Secondary | ICD-10-CM | POA: Diagnosis not present

## 2021-03-01 DIAGNOSIS — E785 Hyperlipidemia, unspecified: Secondary | ICD-10-CM | POA: Diagnosis not present

## 2021-03-01 DIAGNOSIS — M797 Fibromyalgia: Secondary | ICD-10-CM | POA: Diagnosis not present

## 2021-03-01 DIAGNOSIS — I482 Chronic atrial fibrillation, unspecified: Secondary | ICD-10-CM | POA: Diagnosis not present

## 2021-03-01 DIAGNOSIS — N390 Urinary tract infection, site not specified: Secondary | ICD-10-CM | POA: Diagnosis not present

## 2021-03-01 DIAGNOSIS — B9689 Other specified bacterial agents as the cause of diseases classified elsewhere: Secondary | ICD-10-CM | POA: Diagnosis not present

## 2021-03-03 DIAGNOSIS — E785 Hyperlipidemia, unspecified: Secondary | ICD-10-CM | POA: Diagnosis not present

## 2021-03-03 DIAGNOSIS — Z7901 Long term (current) use of anticoagulants: Secondary | ICD-10-CM | POA: Diagnosis not present

## 2021-03-03 DIAGNOSIS — I482 Chronic atrial fibrillation, unspecified: Secondary | ICD-10-CM | POA: Diagnosis not present

## 2021-03-03 DIAGNOSIS — Z792 Long term (current) use of antibiotics: Secondary | ICD-10-CM | POA: Diagnosis not present

## 2021-03-03 DIAGNOSIS — B9562 Methicillin resistant Staphylococcus aureus infection as the cause of diseases classified elsewhere: Secondary | ICD-10-CM | POA: Diagnosis not present

## 2021-03-03 DIAGNOSIS — B9689 Other specified bacterial agents as the cause of diseases classified elsewhere: Secondary | ICD-10-CM | POA: Diagnosis not present

## 2021-03-03 DIAGNOSIS — N390 Urinary tract infection, site not specified: Secondary | ICD-10-CM | POA: Diagnosis not present

## 2021-03-03 DIAGNOSIS — M797 Fibromyalgia: Secondary | ICD-10-CM | POA: Diagnosis not present

## 2021-03-07 DIAGNOSIS — B9689 Other specified bacterial agents as the cause of diseases classified elsewhere: Secondary | ICD-10-CM | POA: Diagnosis not present

## 2021-03-07 DIAGNOSIS — E785 Hyperlipidemia, unspecified: Secondary | ICD-10-CM | POA: Diagnosis not present

## 2021-03-07 DIAGNOSIS — N390 Urinary tract infection, site not specified: Secondary | ICD-10-CM | POA: Diagnosis not present

## 2021-03-07 DIAGNOSIS — I482 Chronic atrial fibrillation, unspecified: Secondary | ICD-10-CM | POA: Diagnosis not present

## 2021-03-07 DIAGNOSIS — M797 Fibromyalgia: Secondary | ICD-10-CM | POA: Diagnosis not present

## 2021-03-07 DIAGNOSIS — Z7901 Long term (current) use of anticoagulants: Secondary | ICD-10-CM | POA: Diagnosis not present

## 2021-03-07 DIAGNOSIS — B9562 Methicillin resistant Staphylococcus aureus infection as the cause of diseases classified elsewhere: Secondary | ICD-10-CM | POA: Diagnosis not present

## 2021-03-07 DIAGNOSIS — Z792 Long term (current) use of antibiotics: Secondary | ICD-10-CM | POA: Diagnosis not present

## 2021-03-14 DIAGNOSIS — Z792 Long term (current) use of antibiotics: Secondary | ICD-10-CM | POA: Diagnosis not present

## 2021-03-14 DIAGNOSIS — E785 Hyperlipidemia, unspecified: Secondary | ICD-10-CM | POA: Diagnosis not present

## 2021-03-14 DIAGNOSIS — N390 Urinary tract infection, site not specified: Secondary | ICD-10-CM | POA: Diagnosis not present

## 2021-03-14 DIAGNOSIS — I482 Chronic atrial fibrillation, unspecified: Secondary | ICD-10-CM | POA: Diagnosis not present

## 2021-03-14 DIAGNOSIS — B9689 Other specified bacterial agents as the cause of diseases classified elsewhere: Secondary | ICD-10-CM | POA: Diagnosis not present

## 2021-03-14 DIAGNOSIS — Z7901 Long term (current) use of anticoagulants: Secondary | ICD-10-CM | POA: Diagnosis not present

## 2021-03-14 DIAGNOSIS — M797 Fibromyalgia: Secondary | ICD-10-CM | POA: Diagnosis not present

## 2021-03-14 DIAGNOSIS — B9562 Methicillin resistant Staphylococcus aureus infection as the cause of diseases classified elsewhere: Secondary | ICD-10-CM | POA: Diagnosis not present

## 2021-03-16 DIAGNOSIS — B029 Zoster without complications: Secondary | ICD-10-CM | POA: Diagnosis not present

## 2021-03-19 DIAGNOSIS — J45901 Unspecified asthma with (acute) exacerbation: Secondary | ICD-10-CM | POA: Diagnosis not present

## 2021-03-24 DIAGNOSIS — E785 Hyperlipidemia, unspecified: Secondary | ICD-10-CM | POA: Diagnosis not present

## 2021-03-24 DIAGNOSIS — B9689 Other specified bacterial agents as the cause of diseases classified elsewhere: Secondary | ICD-10-CM | POA: Diagnosis not present

## 2021-03-24 DIAGNOSIS — N390 Urinary tract infection, site not specified: Secondary | ICD-10-CM | POA: Diagnosis not present

## 2021-03-24 DIAGNOSIS — Z792 Long term (current) use of antibiotics: Secondary | ICD-10-CM | POA: Diagnosis not present

## 2021-03-24 DIAGNOSIS — I482 Chronic atrial fibrillation, unspecified: Secondary | ICD-10-CM | POA: Diagnosis not present

## 2021-03-24 DIAGNOSIS — Z7901 Long term (current) use of anticoagulants: Secondary | ICD-10-CM | POA: Diagnosis not present

## 2021-03-24 DIAGNOSIS — B9562 Methicillin resistant Staphylococcus aureus infection as the cause of diseases classified elsewhere: Secondary | ICD-10-CM | POA: Diagnosis not present

## 2021-03-24 DIAGNOSIS — M797 Fibromyalgia: Secondary | ICD-10-CM | POA: Diagnosis not present

## 2021-03-29 DIAGNOSIS — B9689 Other specified bacterial agents as the cause of diseases classified elsewhere: Secondary | ICD-10-CM | POA: Diagnosis not present

## 2021-03-29 DIAGNOSIS — Z792 Long term (current) use of antibiotics: Secondary | ICD-10-CM | POA: Diagnosis not present

## 2021-03-29 DIAGNOSIS — E785 Hyperlipidemia, unspecified: Secondary | ICD-10-CM | POA: Diagnosis not present

## 2021-03-29 DIAGNOSIS — N390 Urinary tract infection, site not specified: Secondary | ICD-10-CM | POA: Diagnosis not present

## 2021-03-29 DIAGNOSIS — B9562 Methicillin resistant Staphylococcus aureus infection as the cause of diseases classified elsewhere: Secondary | ICD-10-CM | POA: Diagnosis not present

## 2021-03-29 DIAGNOSIS — I482 Chronic atrial fibrillation, unspecified: Secondary | ICD-10-CM | POA: Diagnosis not present

## 2021-03-29 DIAGNOSIS — Z7901 Long term (current) use of anticoagulants: Secondary | ICD-10-CM | POA: Diagnosis not present

## 2021-03-29 DIAGNOSIS — M797 Fibromyalgia: Secondary | ICD-10-CM | POA: Diagnosis not present

## 2021-04-06 DIAGNOSIS — Z7901 Long term (current) use of anticoagulants: Secondary | ICD-10-CM | POA: Diagnosis not present

## 2021-04-06 DIAGNOSIS — B9562 Methicillin resistant Staphylococcus aureus infection as the cause of diseases classified elsewhere: Secondary | ICD-10-CM | POA: Diagnosis not present

## 2021-04-06 DIAGNOSIS — I482 Chronic atrial fibrillation, unspecified: Secondary | ICD-10-CM | POA: Diagnosis not present

## 2021-04-06 DIAGNOSIS — E785 Hyperlipidemia, unspecified: Secondary | ICD-10-CM | POA: Diagnosis not present

## 2021-04-06 DIAGNOSIS — M797 Fibromyalgia: Secondary | ICD-10-CM | POA: Diagnosis not present

## 2021-04-06 DIAGNOSIS — N390 Urinary tract infection, site not specified: Secondary | ICD-10-CM | POA: Diagnosis not present

## 2021-04-06 DIAGNOSIS — B9689 Other specified bacterial agents as the cause of diseases classified elsewhere: Secondary | ICD-10-CM | POA: Diagnosis not present

## 2021-04-06 DIAGNOSIS — Z792 Long term (current) use of antibiotics: Secondary | ICD-10-CM | POA: Diagnosis not present

## 2021-04-14 DIAGNOSIS — E785 Hyperlipidemia, unspecified: Secondary | ICD-10-CM | POA: Diagnosis not present

## 2021-04-14 DIAGNOSIS — B9562 Methicillin resistant Staphylococcus aureus infection as the cause of diseases classified elsewhere: Secondary | ICD-10-CM | POA: Diagnosis not present

## 2021-04-14 DIAGNOSIS — N390 Urinary tract infection, site not specified: Secondary | ICD-10-CM | POA: Diagnosis not present

## 2021-04-14 DIAGNOSIS — Z792 Long term (current) use of antibiotics: Secondary | ICD-10-CM | POA: Diagnosis not present

## 2021-04-14 DIAGNOSIS — Z7901 Long term (current) use of anticoagulants: Secondary | ICD-10-CM | POA: Diagnosis not present

## 2021-04-14 DIAGNOSIS — I482 Chronic atrial fibrillation, unspecified: Secondary | ICD-10-CM | POA: Diagnosis not present

## 2021-04-14 DIAGNOSIS — B9689 Other specified bacterial agents as the cause of diseases classified elsewhere: Secondary | ICD-10-CM | POA: Diagnosis not present

## 2021-04-14 DIAGNOSIS — M797 Fibromyalgia: Secondary | ICD-10-CM | POA: Diagnosis not present

## 2021-04-19 DIAGNOSIS — J45901 Unspecified asthma with (acute) exacerbation: Secondary | ICD-10-CM | POA: Diagnosis not present

## 2021-05-19 DIAGNOSIS — J45901 Unspecified asthma with (acute) exacerbation: Secondary | ICD-10-CM | POA: Diagnosis not present

## 2021-06-09 DIAGNOSIS — I480 Paroxysmal atrial fibrillation: Secondary | ICD-10-CM | POA: Diagnosis not present

## 2021-06-09 DIAGNOSIS — E785 Hyperlipidemia, unspecified: Secondary | ICD-10-CM | POA: Diagnosis not present

## 2021-06-09 DIAGNOSIS — B372 Candidiasis of skin and nail: Secondary | ICD-10-CM | POA: Diagnosis not present

## 2021-06-09 DIAGNOSIS — D519 Vitamin B12 deficiency anemia, unspecified: Secondary | ICD-10-CM | POA: Diagnosis not present

## 2021-06-09 DIAGNOSIS — M797 Fibromyalgia: Secondary | ICD-10-CM | POA: Diagnosis not present

## 2021-06-19 DIAGNOSIS — J45901 Unspecified asthma with (acute) exacerbation: Secondary | ICD-10-CM | POA: Diagnosis not present

## 2021-07-01 DIAGNOSIS — G43009 Migraine without aura, not intractable, without status migrainosus: Secondary | ICD-10-CM | POA: Diagnosis not present

## 2021-07-01 DIAGNOSIS — I480 Paroxysmal atrial fibrillation: Secondary | ICD-10-CM | POA: Diagnosis not present

## 2021-07-01 DIAGNOSIS — M797 Fibromyalgia: Secondary | ICD-10-CM | POA: Diagnosis not present

## 2021-07-19 DIAGNOSIS — J45901 Unspecified asthma with (acute) exacerbation: Secondary | ICD-10-CM | POA: Diagnosis not present

## 2021-08-17 DIAGNOSIS — H2513 Age-related nuclear cataract, bilateral: Secondary | ICD-10-CM | POA: Diagnosis not present

## 2021-08-19 DIAGNOSIS — J45901 Unspecified asthma with (acute) exacerbation: Secondary | ICD-10-CM | POA: Diagnosis not present

## 2021-09-07 DIAGNOSIS — L7211 Pilar cyst: Secondary | ICD-10-CM | POA: Diagnosis not present

## 2021-09-07 DIAGNOSIS — L821 Other seborrheic keratosis: Secondary | ICD-10-CM | POA: Diagnosis not present

## 2021-09-15 DIAGNOSIS — M797 Fibromyalgia: Secondary | ICD-10-CM | POA: Diagnosis not present

## 2021-09-15 DIAGNOSIS — D519 Vitamin B12 deficiency anemia, unspecified: Secondary | ICD-10-CM | POA: Diagnosis not present

## 2021-09-15 DIAGNOSIS — E785 Hyperlipidemia, unspecified: Secondary | ICD-10-CM | POA: Diagnosis not present

## 2021-09-15 DIAGNOSIS — Z1211 Encounter for screening for malignant neoplasm of colon: Secondary | ICD-10-CM | POA: Diagnosis not present

## 2021-09-15 DIAGNOSIS — I480 Paroxysmal atrial fibrillation: Secondary | ICD-10-CM | POA: Diagnosis not present

## 2021-09-19 DIAGNOSIS — J45901 Unspecified asthma with (acute) exacerbation: Secondary | ICD-10-CM | POA: Diagnosis not present

## 2021-10-19 DIAGNOSIS — J45901 Unspecified asthma with (acute) exacerbation: Secondary | ICD-10-CM | POA: Diagnosis not present

## 2021-11-19 DIAGNOSIS — J45901 Unspecified asthma with (acute) exacerbation: Secondary | ICD-10-CM | POA: Diagnosis not present

## 2021-12-19 DIAGNOSIS — J45901 Unspecified asthma with (acute) exacerbation: Secondary | ICD-10-CM | POA: Diagnosis not present

## 2022-01-19 DIAGNOSIS — J45901 Unspecified asthma with (acute) exacerbation: Secondary | ICD-10-CM | POA: Diagnosis not present

## 2022-02-08 DIAGNOSIS — Z1231 Encounter for screening mammogram for malignant neoplasm of breast: Secondary | ICD-10-CM | POA: Diagnosis not present

## 2022-02-08 DIAGNOSIS — R69 Illness, unspecified: Secondary | ICD-10-CM | POA: Diagnosis not present

## 2022-02-08 DIAGNOSIS — B029 Zoster without complications: Secondary | ICD-10-CM | POA: Diagnosis not present

## 2022-02-08 DIAGNOSIS — D519 Vitamin B12 deficiency anemia, unspecified: Secondary | ICD-10-CM | POA: Diagnosis not present

## 2022-02-08 DIAGNOSIS — M797 Fibromyalgia: Secondary | ICD-10-CM | POA: Diagnosis not present

## 2022-02-08 DIAGNOSIS — I480 Paroxysmal atrial fibrillation: Secondary | ICD-10-CM | POA: Diagnosis not present

## 2022-02-08 DIAGNOSIS — E785 Hyperlipidemia, unspecified: Secondary | ICD-10-CM | POA: Diagnosis not present

## 2022-02-13 DIAGNOSIS — Z823 Family history of stroke: Secondary | ICD-10-CM | POA: Diagnosis not present

## 2022-02-13 DIAGNOSIS — R030 Elevated blood-pressure reading, without diagnosis of hypertension: Secondary | ICD-10-CM | POA: Diagnosis not present

## 2022-02-13 DIAGNOSIS — R32 Unspecified urinary incontinence: Secondary | ICD-10-CM | POA: Diagnosis not present

## 2022-02-13 DIAGNOSIS — M797 Fibromyalgia: Secondary | ICD-10-CM | POA: Diagnosis not present

## 2022-02-13 DIAGNOSIS — I4891 Unspecified atrial fibrillation: Secondary | ICD-10-CM | POA: Diagnosis not present

## 2022-02-13 DIAGNOSIS — G43909 Migraine, unspecified, not intractable, without status migrainosus: Secondary | ICD-10-CM | POA: Diagnosis not present

## 2022-02-13 DIAGNOSIS — Z8249 Family history of ischemic heart disease and other diseases of the circulatory system: Secondary | ICD-10-CM | POA: Diagnosis not present

## 2022-02-13 DIAGNOSIS — R69 Illness, unspecified: Secondary | ICD-10-CM | POA: Diagnosis not present

## 2022-02-13 DIAGNOSIS — M62838 Other muscle spasm: Secondary | ICD-10-CM | POA: Diagnosis not present

## 2022-02-13 DIAGNOSIS — E785 Hyperlipidemia, unspecified: Secondary | ICD-10-CM | POA: Diagnosis not present

## 2022-02-19 DIAGNOSIS — J45901 Unspecified asthma with (acute) exacerbation: Secondary | ICD-10-CM | POA: Diagnosis not present

## 2022-03-07 DIAGNOSIS — H9203 Otalgia, bilateral: Secondary | ICD-10-CM | POA: Diagnosis not present

## 2022-03-07 DIAGNOSIS — H6642 Suppurative otitis media, unspecified, left ear: Secondary | ICD-10-CM | POA: Diagnosis not present

## 2022-03-07 DIAGNOSIS — H6121 Impacted cerumen, right ear: Secondary | ICD-10-CM | POA: Diagnosis not present

## 2022-03-20 DIAGNOSIS — J45901 Unspecified asthma with (acute) exacerbation: Secondary | ICD-10-CM | POA: Diagnosis not present

## 2022-03-22 DIAGNOSIS — H9202 Otalgia, left ear: Secondary | ICD-10-CM | POA: Diagnosis not present

## 2022-03-22 DIAGNOSIS — G43009 Migraine without aura, not intractable, without status migrainosus: Secondary | ICD-10-CM | POA: Diagnosis not present

## 2022-03-22 DIAGNOSIS — H6592 Unspecified nonsuppurative otitis media, left ear: Secondary | ICD-10-CM | POA: Diagnosis not present

## 2022-04-20 DIAGNOSIS — J45901 Unspecified asthma with (acute) exacerbation: Secondary | ICD-10-CM | POA: Diagnosis not present

## 2022-05-20 DIAGNOSIS — J45901 Unspecified asthma with (acute) exacerbation: Secondary | ICD-10-CM | POA: Diagnosis not present

## 2022-06-20 DIAGNOSIS — J45901 Unspecified asthma with (acute) exacerbation: Secondary | ICD-10-CM | POA: Diagnosis not present

## 2022-06-21 DIAGNOSIS — E785 Hyperlipidemia, unspecified: Secondary | ICD-10-CM | POA: Diagnosis not present

## 2022-06-21 DIAGNOSIS — I480 Paroxysmal atrial fibrillation: Secondary | ICD-10-CM | POA: Diagnosis not present

## 2022-06-21 DIAGNOSIS — F419 Anxiety disorder, unspecified: Secondary | ICD-10-CM | POA: Diagnosis not present

## 2022-06-21 DIAGNOSIS — Z79899 Other long term (current) drug therapy: Secondary | ICD-10-CM | POA: Diagnosis not present

## 2022-06-21 DIAGNOSIS — D519 Vitamin B12 deficiency anemia, unspecified: Secondary | ICD-10-CM | POA: Diagnosis not present

## 2022-06-21 DIAGNOSIS — F332 Major depressive disorder, recurrent severe without psychotic features: Secondary | ICD-10-CM | POA: Diagnosis not present

## 2022-06-21 DIAGNOSIS — M797 Fibromyalgia: Secondary | ICD-10-CM | POA: Diagnosis not present

## 2022-07-06 DIAGNOSIS — R1011 Right upper quadrant pain: Secondary | ICD-10-CM | POA: Diagnosis not present

## 2022-07-06 DIAGNOSIS — N390 Urinary tract infection, site not specified: Secondary | ICD-10-CM | POA: Diagnosis not present

## 2022-07-06 DIAGNOSIS — Z79899 Other long term (current) drug therapy: Secondary | ICD-10-CM | POA: Diagnosis not present

## 2022-07-06 DIAGNOSIS — N3946 Mixed incontinence: Secondary | ICD-10-CM | POA: Diagnosis not present

## 2022-07-12 DIAGNOSIS — H6691 Otitis media, unspecified, right ear: Secondary | ICD-10-CM | POA: Diagnosis not present

## 2022-07-14 ENCOUNTER — Telehealth: Payer: Self-pay

## 2022-07-14 NOTE — Patient Outreach (Signed)
  Care Coordination   07/14/2022 Name: Connie Jackson MRN: [id number] DOB: [birth year]   Care Coordination Outreach Attempts:  An unsuccessful telephone outreach was attempted today to offer the patient information about available care coordination services.  Follow Up Plan:  Additional outreach attempts will be made to offer the patient care coordination information and services.   Encounter Outcome:  No Answer   Care Coordination Interventions:  No, not indicated    Rowe Pavy, RN, BSN, [facility] [phone]

## 2022-07-20 DIAGNOSIS — J45901 Unspecified asthma with (acute) exacerbation: Secondary | ICD-10-CM | POA: Diagnosis not present

## 2022-08-11 DIAGNOSIS — B961 Klebsiella pneumoniae [K. pneumoniae] as the cause of diseases classified elsewhere: Secondary | ICD-10-CM | POA: Diagnosis not present

## 2022-08-11 DIAGNOSIS — A414 Sepsis due to anaerobes: Secondary | ICD-10-CM | POA: Diagnosis not present

## 2022-08-11 DIAGNOSIS — J9811 Atelectasis: Secondary | ICD-10-CM | POA: Diagnosis not present

## 2022-08-11 DIAGNOSIS — E7800 Pure hypercholesterolemia, unspecified: Secondary | ICD-10-CM | POA: Diagnosis not present

## 2022-08-11 DIAGNOSIS — Z8701 Personal history of pneumonia (recurrent): Secondary | ICD-10-CM | POA: Diagnosis not present

## 2022-08-11 DIAGNOSIS — R6520 Severe sepsis without septic shock: Secondary | ICD-10-CM | POA: Diagnosis not present

## 2022-08-11 DIAGNOSIS — G43909 Migraine, unspecified, not intractable, without status migrainosus: Secondary | ICD-10-CM | POA: Diagnosis not present

## 2022-08-11 DIAGNOSIS — D72829 Elevated white blood cell count, unspecified: Secondary | ICD-10-CM | POA: Diagnosis not present

## 2022-08-11 DIAGNOSIS — A419 Sepsis, unspecified organism: Secondary | ICD-10-CM | POA: Diagnosis not present

## 2022-08-11 DIAGNOSIS — D649 Anemia, unspecified: Secondary | ICD-10-CM | POA: Diagnosis not present

## 2022-08-11 DIAGNOSIS — R1111 Vomiting without nausea: Secondary | ICD-10-CM | POA: Diagnosis not present

## 2022-08-11 DIAGNOSIS — Z880 Allergy status to penicillin: Secondary | ICD-10-CM | POA: Diagnosis not present

## 2022-08-11 DIAGNOSIS — R110 Nausea: Secondary | ICD-10-CM | POA: Diagnosis not present

## 2022-08-11 DIAGNOSIS — N136 Pyonephrosis: Secondary | ICD-10-CM | POA: Diagnosis not present

## 2022-08-11 DIAGNOSIS — R112 Nausea with vomiting, unspecified: Secondary | ICD-10-CM | POA: Diagnosis not present

## 2022-08-11 DIAGNOSIS — Z66 Do not resuscitate: Secondary | ICD-10-CM | POA: Diagnosis not present

## 2022-08-11 DIAGNOSIS — N139 Obstructive and reflux uropathy, unspecified: Secondary | ICD-10-CM | POA: Diagnosis not present

## 2022-08-11 DIAGNOSIS — K5730 Diverticulosis of large intestine without perforation or abscess without bleeding: Secondary | ICD-10-CM | POA: Diagnosis not present

## 2022-08-11 DIAGNOSIS — M797 Fibromyalgia: Secondary | ICD-10-CM | POA: Diagnosis not present

## 2022-08-11 DIAGNOSIS — Z7901 Long term (current) use of anticoagulants: Secondary | ICD-10-CM | POA: Diagnosis not present

## 2022-08-11 DIAGNOSIS — Q638 Other specified congenital malformations of kidney: Secondary | ICD-10-CM | POA: Diagnosis not present

## 2022-08-11 DIAGNOSIS — N132 Hydronephrosis with renal and ureteral calculous obstruction: Secondary | ICD-10-CM | POA: Diagnosis not present

## 2022-08-11 DIAGNOSIS — N200 Calculus of kidney: Secondary | ICD-10-CM | POA: Diagnosis not present

## 2022-08-11 DIAGNOSIS — N201 Calculus of ureter: Secondary | ICD-10-CM | POA: Diagnosis not present

## 2022-08-11 DIAGNOSIS — R7881 Bacteremia: Secondary | ICD-10-CM | POA: Diagnosis not present

## 2022-08-11 DIAGNOSIS — N1330 Unspecified hydronephrosis: Secondary | ICD-10-CM | POA: Diagnosis not present

## 2022-08-11 DIAGNOSIS — R1084 Generalized abdominal pain: Secondary | ICD-10-CM | POA: Diagnosis not present

## 2022-08-11 DIAGNOSIS — Z743 Need for continuous supervision: Secondary | ICD-10-CM | POA: Diagnosis not present

## 2022-08-11 DIAGNOSIS — J150 Pneumonia due to Klebsiella pneumoniae: Secondary | ICD-10-CM | POA: Diagnosis not present

## 2022-08-11 DIAGNOSIS — N12 Tubulo-interstitial nephritis, not specified as acute or chronic: Secondary | ICD-10-CM | POA: Diagnosis not present

## 2022-08-11 DIAGNOSIS — N179 Acute kidney failure, unspecified: Secondary | ICD-10-CM | POA: Diagnosis not present

## 2022-08-11 DIAGNOSIS — I482 Chronic atrial fibrillation, unspecified: Secondary | ICD-10-CM | POA: Diagnosis not present

## 2022-08-11 DIAGNOSIS — I4510 Unspecified right bundle-branch block: Secondary | ICD-10-CM | POA: Diagnosis not present

## 2022-08-11 DIAGNOSIS — A4159 Other Gram-negative sepsis: Secondary | ICD-10-CM | POA: Diagnosis not present

## 2022-08-11 DIAGNOSIS — R109 Unspecified abdominal pain: Secondary | ICD-10-CM | POA: Diagnosis not present

## 2022-08-11 DIAGNOSIS — N130 Hydronephrosis with ureteropelvic junction obstruction: Secondary | ICD-10-CM | POA: Diagnosis not present

## 2022-08-11 DIAGNOSIS — N390 Urinary tract infection, site not specified: Secondary | ICD-10-CM | POA: Diagnosis not present

## 2022-08-11 DIAGNOSIS — J45909 Unspecified asthma, uncomplicated: Secondary | ICD-10-CM | POA: Diagnosis not present

## 2022-08-11 DIAGNOSIS — N170 Acute kidney failure with tubular necrosis: Secondary | ICD-10-CM | POA: Diagnosis not present

## 2022-08-12 DIAGNOSIS — N200 Calculus of kidney: Secondary | ICD-10-CM | POA: Diagnosis not present

## 2022-08-13 DIAGNOSIS — N200 Calculus of kidney: Secondary | ICD-10-CM | POA: Diagnosis not present

## 2022-08-16 DIAGNOSIS — N12 Tubulo-interstitial nephritis, not specified as acute or chronic: Secondary | ICD-10-CM | POA: Diagnosis not present

## 2022-08-16 DIAGNOSIS — D72829 Elevated white blood cell count, unspecified: Secondary | ICD-10-CM | POA: Diagnosis not present

## 2022-08-16 DIAGNOSIS — N139 Obstructive and reflux uropathy, unspecified: Secondary | ICD-10-CM | POA: Diagnosis not present

## 2022-08-16 DIAGNOSIS — A414 Sepsis due to anaerobes: Secondary | ICD-10-CM | POA: Diagnosis not present

## 2022-08-16 DIAGNOSIS — N179 Acute kidney failure, unspecified: Secondary | ICD-10-CM | POA: Diagnosis not present

## 2022-08-16 DIAGNOSIS — N200 Calculus of kidney: Secondary | ICD-10-CM | POA: Diagnosis not present

## 2022-08-17 DIAGNOSIS — Z452 Encounter for adjustment and management of vascular access device: Secondary | ICD-10-CM | POA: Diagnosis not present

## 2022-08-17 DIAGNOSIS — Z792 Long term (current) use of antibiotics: Secondary | ICD-10-CM | POA: Diagnosis not present

## 2022-08-17 DIAGNOSIS — N130 Hydronephrosis with ureteropelvic junction obstruction: Secondary | ICD-10-CM | POA: Diagnosis not present

## 2022-08-17 DIAGNOSIS — J45909 Unspecified asthma, uncomplicated: Secondary | ICD-10-CM | POA: Diagnosis not present

## 2022-08-17 DIAGNOSIS — A4159 Other Gram-negative sepsis: Secondary | ICD-10-CM | POA: Diagnosis not present

## 2022-08-17 DIAGNOSIS — N179 Acute kidney failure, unspecified: Secondary | ICD-10-CM | POA: Diagnosis not present

## 2022-08-17 DIAGNOSIS — N200 Calculus of kidney: Secondary | ICD-10-CM | POA: Diagnosis not present

## 2022-08-17 DIAGNOSIS — Z5181 Encounter for therapeutic drug level monitoring: Secondary | ICD-10-CM | POA: Diagnosis not present

## 2022-08-17 DIAGNOSIS — K5730 Diverticulosis of large intestine without perforation or abscess without bleeding: Secondary | ICD-10-CM | POA: Diagnosis not present

## 2022-08-17 DIAGNOSIS — N12 Tubulo-interstitial nephritis, not specified as acute or chronic: Secondary | ICD-10-CM | POA: Diagnosis not present

## 2022-08-17 DIAGNOSIS — Z7901 Long term (current) use of anticoagulants: Secondary | ICD-10-CM | POA: Diagnosis not present

## 2022-08-17 DIAGNOSIS — G43909 Migraine, unspecified, not intractable, without status migrainosus: Secondary | ICD-10-CM | POA: Diagnosis not present

## 2022-08-17 DIAGNOSIS — N390 Urinary tract infection, site not specified: Secondary | ICD-10-CM | POA: Diagnosis not present

## 2022-08-17 DIAGNOSIS — M797 Fibromyalgia: Secondary | ICD-10-CM | POA: Diagnosis not present

## 2022-08-17 DIAGNOSIS — I4891 Unspecified atrial fibrillation: Secondary | ICD-10-CM | POA: Diagnosis not present

## 2022-08-17 DIAGNOSIS — E785 Hyperlipidemia, unspecified: Secondary | ICD-10-CM | POA: Diagnosis not present

## 2022-08-20 DIAGNOSIS — J45901 Unspecified asthma with (acute) exacerbation: Secondary | ICD-10-CM | POA: Diagnosis not present

## 2022-08-21 DIAGNOSIS — A414 Sepsis due to anaerobes: Secondary | ICD-10-CM | POA: Diagnosis not present

## 2022-08-21 DIAGNOSIS — I4891 Unspecified atrial fibrillation: Secondary | ICD-10-CM | POA: Diagnosis not present

## 2022-08-21 DIAGNOSIS — N12 Tubulo-interstitial nephritis, not specified as acute or chronic: Secondary | ICD-10-CM | POA: Diagnosis not present

## 2022-08-22 DIAGNOSIS — Z8619 Personal history of other infectious and parasitic diseases: Secondary | ICD-10-CM | POA: Diagnosis not present

## 2022-08-22 DIAGNOSIS — Z960 Presence of urogenital implants: Secondary | ICD-10-CM | POA: Diagnosis not present

## 2022-08-22 DIAGNOSIS — N200 Calculus of kidney: Secondary | ICD-10-CM | POA: Diagnosis not present

## 2022-08-28 DIAGNOSIS — Z7901 Long term (current) use of anticoagulants: Secondary | ICD-10-CM | POA: Diagnosis not present

## 2022-08-28 DIAGNOSIS — E785 Hyperlipidemia, unspecified: Secondary | ICD-10-CM | POA: Diagnosis not present

## 2022-08-28 DIAGNOSIS — N179 Acute kidney failure, unspecified: Secondary | ICD-10-CM | POA: Diagnosis not present

## 2022-08-28 DIAGNOSIS — A4159 Other Gram-negative sepsis: Secondary | ICD-10-CM | POA: Diagnosis not present

## 2022-09-08 DIAGNOSIS — Z79899 Other long term (current) drug therapy: Secondary | ICD-10-CM | POA: Diagnosis not present

## 2022-09-08 DIAGNOSIS — Z8701 Personal history of pneumonia (recurrent): Secondary | ICD-10-CM | POA: Diagnosis not present

## 2022-09-08 DIAGNOSIS — Z880 Allergy status to penicillin: Secondary | ICD-10-CM | POA: Diagnosis not present

## 2022-09-08 DIAGNOSIS — N202 Calculus of kidney with calculus of ureter: Secondary | ICD-10-CM | POA: Diagnosis not present

## 2022-09-08 DIAGNOSIS — Z8619 Personal history of other infectious and parasitic diseases: Secondary | ICD-10-CM | POA: Diagnosis not present

## 2022-09-08 DIAGNOSIS — N200 Calculus of kidney: Secondary | ICD-10-CM | POA: Diagnosis not present

## 2022-09-08 DIAGNOSIS — Z960 Presence of urogenital implants: Secondary | ICD-10-CM | POA: Diagnosis not present

## 2022-09-08 DIAGNOSIS — Z882 Allergy status to sulfonamides status: Secondary | ICD-10-CM | POA: Diagnosis not present

## 2022-09-08 DIAGNOSIS — I4510 Unspecified right bundle-branch block: Secondary | ICD-10-CM | POA: Diagnosis not present

## 2022-09-08 DIAGNOSIS — R569 Unspecified convulsions: Secondary | ICD-10-CM | POA: Diagnosis not present

## 2022-09-08 DIAGNOSIS — Z888 Allergy status to other drugs, medicaments and biological substances status: Secondary | ICD-10-CM | POA: Diagnosis not present

## 2022-09-08 DIAGNOSIS — N201 Calculus of ureter: Secondary | ICD-10-CM | POA: Diagnosis not present

## 2022-09-15 DIAGNOSIS — N200 Calculus of kidney: Secondary | ICD-10-CM | POA: Diagnosis not present

## 2022-09-15 DIAGNOSIS — Z960 Presence of urogenital implants: Secondary | ICD-10-CM | POA: Diagnosis not present

## 2022-09-19 DIAGNOSIS — M797 Fibromyalgia: Secondary | ICD-10-CM | POA: Diagnosis not present

## 2022-09-19 DIAGNOSIS — F319 Bipolar disorder, unspecified: Secondary | ICD-10-CM | POA: Diagnosis not present

## 2022-09-19 DIAGNOSIS — I480 Paroxysmal atrial fibrillation: Secondary | ICD-10-CM | POA: Diagnosis not present

## 2022-09-19 DIAGNOSIS — D519 Vitamin B12 deficiency anemia, unspecified: Secondary | ICD-10-CM | POA: Diagnosis not present

## 2022-09-19 DIAGNOSIS — E785 Hyperlipidemia, unspecified: Secondary | ICD-10-CM | POA: Diagnosis not present

## 2022-09-19 DIAGNOSIS — F332 Major depressive disorder, recurrent severe without psychotic features: Secondary | ICD-10-CM | POA: Diagnosis not present

## 2022-09-20 DIAGNOSIS — J45901 Unspecified asthma with (acute) exacerbation: Secondary | ICD-10-CM | POA: Diagnosis not present

## 2022-09-25 DIAGNOSIS — D519 Vitamin B12 deficiency anemia, unspecified: Secondary | ICD-10-CM | POA: Diagnosis not present

## 2022-09-25 DIAGNOSIS — E785 Hyperlipidemia, unspecified: Secondary | ICD-10-CM | POA: Diagnosis not present

## 2022-09-25 DIAGNOSIS — I480 Paroxysmal atrial fibrillation: Secondary | ICD-10-CM | POA: Diagnosis not present

## 2022-10-20 DIAGNOSIS — J45901 Unspecified asthma with (acute) exacerbation: Secondary | ICD-10-CM | POA: Diagnosis not present

## 2022-11-20 DIAGNOSIS — J45901 Unspecified asthma with (acute) exacerbation: Secondary | ICD-10-CM | POA: Diagnosis not present

## 2022-12-20 DIAGNOSIS — J45901 Unspecified asthma with (acute) exacerbation: Secondary | ICD-10-CM | POA: Diagnosis not present

## 2023-01-12 DIAGNOSIS — D519 Vitamin B12 deficiency anemia, unspecified: Secondary | ICD-10-CM | POA: Diagnosis not present

## 2023-01-12 DIAGNOSIS — F319 Bipolar disorder, unspecified: Secondary | ICD-10-CM | POA: Diagnosis not present

## 2023-01-12 DIAGNOSIS — I480 Paroxysmal atrial fibrillation: Secondary | ICD-10-CM | POA: Diagnosis not present

## 2023-01-12 DIAGNOSIS — B3731 Acute candidiasis of vulva and vagina: Secondary | ICD-10-CM | POA: Diagnosis not present

## 2023-01-12 DIAGNOSIS — E785 Hyperlipidemia, unspecified: Secondary | ICD-10-CM | POA: Diagnosis not present

## 2023-01-12 DIAGNOSIS — M797 Fibromyalgia: Secondary | ICD-10-CM | POA: Diagnosis not present

## 2023-01-12 DIAGNOSIS — R7301 Impaired fasting glucose: Secondary | ICD-10-CM | POA: Diagnosis not present

## 2023-01-12 DIAGNOSIS — F332 Major depressive disorder, recurrent severe without psychotic features: Secondary | ICD-10-CM | POA: Diagnosis not present

## 2023-01-20 DIAGNOSIS — J45901 Unspecified asthma with (acute) exacerbation: Secondary | ICD-10-CM | POA: Diagnosis not present

## 2023-02-14 DIAGNOSIS — E039 Hypothyroidism, unspecified: Secondary | ICD-10-CM | POA: Diagnosis not present

## 2023-02-20 DIAGNOSIS — J45901 Unspecified asthma with (acute) exacerbation: Secondary | ICD-10-CM | POA: Diagnosis not present

## 2023-03-20 DIAGNOSIS — J45901 Unspecified asthma with (acute) exacerbation: Secondary | ICD-10-CM | POA: Diagnosis not present

## 2023-03-21 ENCOUNTER — Other Ambulatory Visit: Payer: Self-pay

## 2023-03-21 ENCOUNTER — Inpatient Hospital Stay (HOSPITAL_COMMUNITY)
Admission: EM | Admit: 2023-03-21 | Discharge: 2023-03-23 | DRG: 563 | Disposition: A | Discharge status: Skilled Nursing Facility | Attending: Internal Medicine | Admitting: Internal Medicine

## 2023-03-21 ENCOUNTER — Encounter (HOSPITAL_COMMUNITY): Payer: Self-pay

## 2023-03-21 ENCOUNTER — Emergency Department (HOSPITAL_COMMUNITY)

## 2023-03-21 DIAGNOSIS — Z9049 Acquired absence of other specified parts of digestive tract: Secondary | ICD-10-CM

## 2023-03-21 DIAGNOSIS — Z7901 Long term (current) use of anticoagulants: Secondary | ICD-10-CM

## 2023-03-21 DIAGNOSIS — I10 Essential (primary) hypertension: Secondary | ICD-10-CM | POA: Diagnosis present

## 2023-03-21 DIAGNOSIS — R001 Bradycardia, unspecified: Secondary | ICD-10-CM | POA: Diagnosis not present

## 2023-03-21 DIAGNOSIS — S8252XA Displaced fracture of medial malleolus of left tibia, initial encounter for closed fracture: Secondary | ICD-10-CM | POA: Diagnosis present

## 2023-03-21 DIAGNOSIS — Z6836 Body mass index (BMI) 36.0-36.9, adult: Secondary | ICD-10-CM

## 2023-03-21 DIAGNOSIS — I517 Cardiomegaly: Secondary | ICD-10-CM | POA: Diagnosis not present

## 2023-03-21 DIAGNOSIS — M25561 Pain in right knee: Secondary | ICD-10-CM

## 2023-03-21 DIAGNOSIS — F419 Anxiety disorder, unspecified: Secondary | ICD-10-CM | POA: Diagnosis present

## 2023-03-21 DIAGNOSIS — I4510 Unspecified right bundle-branch block: Secondary | ICD-10-CM | POA: Diagnosis present

## 2023-03-21 DIAGNOSIS — Z79890 Hormone replacement therapy (postmenopausal): Secondary | ICD-10-CM

## 2023-03-21 DIAGNOSIS — S82431A Displaced oblique fracture of shaft of right fibula, initial encounter for closed fracture: Secondary | ICD-10-CM | POA: Diagnosis not present

## 2023-03-21 DIAGNOSIS — Z90710 Acquired absence of both cervix and uterus: Secondary | ICD-10-CM

## 2023-03-21 DIAGNOSIS — Z882 Allergy status to sulfonamides status: Secondary | ICD-10-CM

## 2023-03-21 DIAGNOSIS — Z87442 Personal history of urinary calculi: Secondary | ICD-10-CM

## 2023-03-21 DIAGNOSIS — M25572 Pain in left ankle and joints of left foot: Secondary | ICD-10-CM | POA: Diagnosis not present

## 2023-03-21 DIAGNOSIS — I951 Orthostatic hypotension: Secondary | ICD-10-CM | POA: Diagnosis present

## 2023-03-21 DIAGNOSIS — E785 Hyperlipidemia, unspecified: Secondary | ICD-10-CM | POA: Diagnosis present

## 2023-03-21 DIAGNOSIS — M170 Bilateral primary osteoarthritis of knee: Secondary | ICD-10-CM | POA: Diagnosis present

## 2023-03-21 DIAGNOSIS — E039 Hypothyroidism, unspecified: Secondary | ICD-10-CM | POA: Diagnosis present

## 2023-03-21 DIAGNOSIS — W19XXXA Unspecified fall, initial encounter: Secondary | ICD-10-CM | POA: Diagnosis not present

## 2023-03-21 DIAGNOSIS — D72829 Elevated white blood cell count, unspecified: Secondary | ICD-10-CM | POA: Diagnosis present

## 2023-03-21 DIAGNOSIS — M1711 Unilateral primary osteoarthritis, right knee: Secondary | ICD-10-CM | POA: Diagnosis not present

## 2023-03-21 DIAGNOSIS — M25571 Pain in right ankle and joints of right foot: Secondary | ICD-10-CM | POA: Diagnosis not present

## 2023-03-21 DIAGNOSIS — R5382 Chronic fatigue, unspecified: Secondary | ICD-10-CM | POA: Diagnosis present

## 2023-03-21 DIAGNOSIS — Z880 Allergy status to penicillin: Secondary | ICD-10-CM

## 2023-03-21 DIAGNOSIS — Z043 Encounter for examination and observation following other accident: Secondary | ICD-10-CM | POA: Diagnosis not present

## 2023-03-21 DIAGNOSIS — W1830XA Fall on same level, unspecified, initial encounter: Secondary | ICD-10-CM | POA: Diagnosis present

## 2023-03-21 DIAGNOSIS — Z66 Do not resuscitate: Secondary | ICD-10-CM | POA: Diagnosis present

## 2023-03-21 DIAGNOSIS — Z7401 Bed confinement status: Secondary | ICD-10-CM

## 2023-03-21 DIAGNOSIS — S82409A Unspecified fracture of shaft of unspecified fibula, initial encounter for closed fracture: Secondary | ICD-10-CM | POA: Diagnosis not present

## 2023-03-21 DIAGNOSIS — S8264XA Nondisplaced fracture of lateral malleolus of right fibula, initial encounter for closed fracture: Principal | ICD-10-CM

## 2023-03-21 DIAGNOSIS — L309 Dermatitis, unspecified: Secondary | ICD-10-CM | POA: Diagnosis present

## 2023-03-21 DIAGNOSIS — M25562 Pain in left knee: Secondary | ICD-10-CM

## 2023-03-21 DIAGNOSIS — N179 Acute kidney failure, unspecified: Secondary | ICD-10-CM | POA: Diagnosis present

## 2023-03-21 DIAGNOSIS — M25462 Effusion, left knee: Secondary | ICD-10-CM | POA: Diagnosis not present

## 2023-03-21 DIAGNOSIS — F319 Bipolar disorder, unspecified: Secondary | ICD-10-CM | POA: Diagnosis present

## 2023-03-21 DIAGNOSIS — S82832A Other fracture of upper and lower end of left fibula, initial encounter for closed fracture: Secondary | ICD-10-CM | POA: Diagnosis present

## 2023-03-21 DIAGNOSIS — I4891 Unspecified atrial fibrillation: Secondary | ICD-10-CM | POA: Diagnosis present

## 2023-03-21 DIAGNOSIS — R6889 Other general symptoms and signs: Secondary | ICD-10-CM | POA: Diagnosis not present

## 2023-03-21 DIAGNOSIS — Y92009 Unspecified place in unspecified non-institutional (private) residence as the place of occurrence of the external cause: Secondary | ICD-10-CM

## 2023-03-21 DIAGNOSIS — S82401A Unspecified fracture of shaft of right fibula, initial encounter for closed fracture: Secondary | ICD-10-CM | POA: Diagnosis present

## 2023-03-21 DIAGNOSIS — E872 Acidosis, unspecified: Secondary | ICD-10-CM | POA: Diagnosis present

## 2023-03-21 DIAGNOSIS — R5381 Other malaise: Secondary | ICD-10-CM | POA: Diagnosis present

## 2023-03-21 DIAGNOSIS — M797 Fibromyalgia: Secondary | ICD-10-CM | POA: Diagnosis present

## 2023-03-21 DIAGNOSIS — E669 Obesity, unspecified: Secondary | ICD-10-CM | POA: Diagnosis present

## 2023-03-21 DIAGNOSIS — I499 Cardiac arrhythmia, unspecified: Secondary | ICD-10-CM | POA: Diagnosis not present

## 2023-03-21 DIAGNOSIS — M1712 Unilateral primary osteoarthritis, left knee: Secondary | ICD-10-CM | POA: Diagnosis not present

## 2023-03-21 DIAGNOSIS — G8929 Other chronic pain: Secondary | ICD-10-CM | POA: Diagnosis present

## 2023-03-21 DIAGNOSIS — S8255XA Nondisplaced fracture of medial malleolus of left tibia, initial encounter for closed fracture: Secondary | ICD-10-CM

## 2023-03-21 DIAGNOSIS — S8265XA Nondisplaced fracture of lateral malleolus of left fibula, initial encounter for closed fracture: Secondary | ICD-10-CM | POA: Diagnosis not present

## 2023-03-21 DIAGNOSIS — Z888 Allergy status to other drugs, medicaments and biological substances status: Secondary | ICD-10-CM

## 2023-03-21 DIAGNOSIS — Z79899 Other long term (current) drug therapy: Secondary | ICD-10-CM

## 2023-03-21 DIAGNOSIS — R32 Unspecified urinary incontinence: Secondary | ICD-10-CM | POA: Diagnosis present

## 2023-03-21 LAB — CBC WITH DIFFERENTIAL/PLATELET
Abs Immature Granulocytes: 0.04 10*3/uL (ref 0.00–0.07)
Basophils Absolute: 0 10*3/uL (ref 0.0–0.1)
Basophils Relative: 0 %
Eosinophils Absolute: 0.4 10*3/uL (ref 0.0–0.5)
Eosinophils Relative: 3 %
HCT: 40 % (ref 36.0–46.0)
Hemoglobin: 13 g/dL (ref 12.0–15.0)
Immature Granulocytes: 0 %
Lymphocytes Relative: 12 %
Lymphs Abs: 1.6 10*3/uL (ref 0.7–4.0)
MCH: 31.1 pg (ref 26.0–34.0)
MCHC: 32.5 g/dL (ref 30.0–36.0)
MCV: 95.7 fL (ref 80.0–100.0)
Monocytes Absolute: 0.9 10*3/uL (ref 0.1–1.0)
Monocytes Relative: 7 %
Neutro Abs: 10.2 10*3/uL — ABNORMAL HIGH (ref 1.7–7.7)
Neutrophils Relative %: 78 %
Platelets: 318 10*3/uL (ref 150–400)
RBC: 4.18 MIL/uL (ref 3.87–5.11)
RDW: 13.5 % (ref 11.5–15.5)
WBC: 13.3 10*3/uL — ABNORMAL HIGH (ref 4.0–10.5)
nRBC: 0 % (ref 0.0–0.2)

## 2023-03-21 LAB — BASIC METABOLIC PANEL
Anion gap: 16 — ABNORMAL HIGH (ref 5–15)
BUN: 12 mg/dL (ref 8–23)
CO2: 21 mmol/L — ABNORMAL LOW (ref 22–32)
Calcium: 9.2 mg/dL (ref 8.9–10.3)
Chloride: 100 mmol/L (ref 98–111)
Creatinine, Ser: 1.19 mg/dL — ABNORMAL HIGH (ref 0.44–1.00)
GFR, Estimated: 50 mL/min — ABNORMAL LOW (ref >60)
Glucose, Bld: 118 mg/dL — ABNORMAL HIGH (ref 70–99)
Potassium: 3.9 mmol/L (ref 3.5–5.1)
Sodium: 137 mmol/L (ref 135–145)

## 2023-03-21 LAB — LACTIC ACID, PLASMA
Lactic Acid, Venous: 1.5 mmol/L (ref 0.5–1.9)
Lactic Acid, Venous: 2 mmol/L (ref 0.5–1.9)
Lactic Acid, Venous: 2 mmol/L (ref 0.5–1.9)

## 2023-03-21 MED ORDER — HYDROMORPHONE HCL 1 MG/ML PO LIQD
0.5000 mg | ORAL | Status: DC | PRN
  Administered 2023-03-21: 0.5 mg via ORAL
  Filled 2023-03-21: qty 1

## 2023-03-21 MED ORDER — OXYCODONE HCL 5 MG PO TABS: 5.0000 mg | ORAL_TABLET | ORAL | Status: DC | PRN

## 2023-03-21 MED ORDER — POLYETHYLENE GLYCOL 3350 17 G PO PACK: 17.0000 g | PACK | Freq: Every day | ORAL | Status: DC | PRN

## 2023-03-21 MED ORDER — FESOTERODINE FUMARATE ER 4 MG PO TB24
4.0000 mg | ORAL_TABLET | Freq: Every day | ORAL | Status: DC
  Administered 2023-03-21 – 2023-03-23 (×3): 4 mg via ORAL
  Filled 2023-03-21 (×4): qty 1

## 2023-03-21 MED ORDER — ACETAMINOPHEN 500 MG PO TABS
1000.0000 mg | ORAL_TABLET | Freq: Three times a day (TID) | ORAL | Status: DC
  Filled 2023-03-21 (×2): qty 2

## 2023-03-21 MED ORDER — ALPRAZOLAM 0.25 MG PO TABS
0.5000 mg | ORAL_TABLET | Freq: Every day | ORAL | Status: DC
  Administered 2023-03-21 – 2023-03-22 (×2): 0.5 mg via ORAL
  Filled 2023-03-21 (×2): qty 2

## 2023-03-21 MED ORDER — SENNOSIDES-DOCUSATE SODIUM 8.6-50 MG PO TABS: 1.0000 | ORAL_TABLET | Freq: Every evening | ORAL | Status: DC | PRN

## 2023-03-21 MED ORDER — LACTATED RINGERS IV BOLUS
1000.0000 mL | Freq: Once | INTRAVENOUS | Status: AC
  Administered 2023-03-21: 1000 mL via INTRAVENOUS

## 2023-03-21 MED ORDER — APIXABAN 5 MG PO TABS
5.0000 mg | ORAL_TABLET | Freq: Two times a day (BID) | ORAL | Status: DC
  Administered 2023-03-21 – 2023-03-23 (×5): 5 mg via ORAL
  Filled 2023-03-21 (×5): qty 1

## 2023-03-21 MED ORDER — DILTIAZEM HCL ER COATED BEADS 240 MG PO CP24
240.0000 mg | ORAL_CAPSULE | Freq: Every day | ORAL | Status: DC
  Administered 2023-03-21: 240 mg via ORAL
  Filled 2023-03-21 (×2): qty 1

## 2023-03-21 MED ORDER — LEVOTHYROXINE SODIUM 50 MCG PO TABS
50.0000 ug | ORAL_TABLET | Freq: Every morning | ORAL | Status: DC
  Administered 2023-03-21 – 2023-03-23 (×3): 50 ug via ORAL
  Filled 2023-03-21 (×2): qty 1
  Filled 2023-03-21: qty 2

## 2023-03-21 MED ORDER — OXYCODONE-ACETAMINOPHEN 5-325 MG PO TABS
1.0000 | ORAL_TABLET | ORAL | Status: DC | PRN
  Administered 2023-03-21: 1 via ORAL
  Filled 2023-03-21: qty 1

## 2023-03-21 MED ORDER — METOPROLOL SUCCINATE ER 50 MG PO TB24
100.0000 mg | ORAL_TABLET | Freq: Every day | ORAL | Status: DC
  Administered 2023-03-21: 100 mg via ORAL
  Filled 2023-03-21: qty 2

## 2023-03-21 MED ORDER — CITALOPRAM HYDROBROMIDE 20 MG PO TABS
40.0000 mg | ORAL_TABLET | Freq: Every day | ORAL | Status: DC
  Administered 2023-03-21 – 2023-03-23 (×3): 40 mg via ORAL
  Filled 2023-03-21 (×2): qty 2
  Filled 2023-03-21: qty 4

## 2023-03-21 MED ORDER — MORPHINE SULFATE (PF) 4 MG/ML IV SOLN
4.0000 mg | Freq: Once | INTRAVENOUS | Status: AC
  Administered 2023-03-21: 4 mg via INTRAVENOUS
  Filled 2023-03-21: qty 1

## 2023-03-21 MED ORDER — TIZANIDINE HCL 4 MG PO TABS
12.0000 mg | ORAL_TABLET | Freq: Every day | ORAL | Status: DC
  Administered 2023-03-21 – 2023-03-22 (×2): 12 mg via ORAL
  Filled 2023-03-21 (×2): qty 3

## 2023-03-21 MED ORDER — ROSUVASTATIN CALCIUM 5 MG PO TABS
10.0000 mg | ORAL_TABLET | Freq: Every day | ORAL | Status: DC
  Administered 2023-03-21 – 2023-03-22 (×2): 10 mg via ORAL
  Filled 2023-03-21 (×2): qty 2

## 2023-03-21 MED ORDER — IBUPROFEN 200 MG PO TABS: 600.0000 mg | ORAL_TABLET | Freq: Once | ORAL | Status: DC

## 2023-03-21 NOTE — Progress Notes (Signed)
 Transition of Care [facility]) - CAGE-AID Screening   Patient Details  Name: Connie Jackson MRN: [id number] Date of Birth: May 20, 1955  Transition of Care [facility]) CM/SW Contact:    Katha Hamming, RN Phone Number: 03/21/2023, 7:55 PM   Clinical Narrative:  Denies drug and alcohol use, no resources indicated.  CAGE-AID Screening:    Have You Ever Felt You Ought to Cut Down on Your Drinking or Drug Use?: No Have People Annoyed You By Critizing Your Drinking Or Drug Use?: No Have You Felt Bad Or Guilty About Your Drinking Or Drug Use?: No Have You Ever Had a Drink or Used Drugs First Thing In The Morning to Steady Your Nerves or to Get Rid of a Hangover?: No CAGE-AID Score: 0  Substance Abuse Education Offered: No

## 2023-03-21 NOTE — Evaluation (Signed)
 Physical Therapy Evaluation Patient Details Name: Connie Jackson MRN: [id number] DOB: 28-Jan-1955 Today's Date: 03/21/2023  History of Present Illness  Pt is a 68 y.o. F who presents after a fall with right acute oblique fracture of the distal fibular metadiaphysis extending  to the level of the distal tibiofibular syndesmosis and suspected tiny acute avulsion injury of the distal tip of the medial  malleolus. Significant PMH: atrial fibrillation.  Clinical Impression  PTA, pt lives with her spouse in a ramped entrance ranch style home. Pt spouse has dementia and pt assists with meal prep. Pt self reports difficulty with ADL's such as bathing since Christmas day due to fatigue and energy level. Pt presents currently with RLE weakness, pain, and difficulty with ambulation. Pt requiring min assist for standing up to walker and is unable to weight shift onto right lower extremity in order to ambulate. In light of deficits and decreased available physical assist at home, recommend continued inpatient follow up therapy, <3 hours/day in order to progress to modI level.        If plan is discharge home, recommend the following: A lot of help with walking and/or transfers;A little help with bathing/dressing/bathroom;Assistance with cooking/housework;Assist for transportation;Help with stairs or ramp for entrance   Can travel by private vehicle   No    Equipment Recommendations Rolling walker (2 wheels);Wheelchair (measurements PT);Wheelchair cushion (measurements PT);BSC/3in1  Recommendations for Other Services       Functional Status Assessment Patient has had a recent decline in their functional status and demonstrates the ability to make significant improvements in function in a reasonable and predictable amount of time.     Precautions / Restrictions Precautions Precautions: Fall Required Braces or Orthoses: Other Brace Other Brace: L air cast; R CAM boot Restrictions Weight Bearing  Restrictions Per Provider Order: Yes RLE Weight Bearing Per Provider Order: Weight bearing as tolerated LLE Weight Bearing Per Provider Order: Weight bearing as tolerated      Mobility  Bed Mobility Overal bed mobility: Needs Assistance Bed Mobility: Supine to Sit, Sit to Supine     Supine to sit: Contact guard Sit to supine: Contact guard assist   General bed mobility comments: Pt able to navigate on/off stretcher without physical assist from PT. Use of arms to move RLE    Transfers Overall transfer level: Needs assistance Equipment used: Rolling walker (2 wheels) Transfers: Sit to/from Stand Sit to Stand: Min assist           General transfer comment: MinA to stand from stretcher x 2. Increased weight shift to LLE    Ambulation/Gait               General Gait Details: unable  Stairs            Wheelchair Mobility     Tilt Bed    Modified Rankin (Stroke Patients Only)       Balance Overall balance assessment: Needs assistance Sitting-balance support: Feet supported Sitting balance-Leahy Scale: Good     Standing balance support: Bilateral upper extremity supported Standing balance-Leahy Scale: Poor Standing balance comment: reliant on BUE support                             Pertinent Vitals/Pain Pain Assessment Pain Assessment: Faces Faces Pain Scale: Hurts even more Pain Location: R ankle Pain Descriptors / Indicators: Grimacing, Guarding Pain Intervention(s): Monitored during session    Home Living Family/patient expects to be  discharged to:: Private residence Living Arrangements: Spouse/significant other Available Help at Discharge: Family Type of Home: House Home Access: Ramped entrance       Home Layout: One level Home Equipment: Shower seat;Grab bars - tub/shower;Rollator (4 wheels);Wheelchair - manual (MIL's equipment)      Prior Function Prior Level of Function : Independent/Modified Independent              Mobility Comments: Pt reports difficulty with static standing for long periods, no AD ADLs Comments: Pt reports not showering since christmas day due to lack of energy/fibromyalgia/being cold     Extremity/Trunk Assessment   Upper Extremity Assessment Upper Extremity Assessment: Defer to OT evaluation    Lower Extremity Assessment Lower Extremity Assessment: RLE deficits/detail;LLE deficits/detail RLE Deficits / Details: Unable to perform SLR LLE Deficits / Details: Able to perform SLR       Communication   Communication Communication: No apparent difficulties    Cognition Arousal: Alert Behavior During Therapy: WFL for tasks assessed/performed   PT - Cognitive impairments: No apparent impairments                       PT - Cognition Comments: Transparent regarding deficits Following commands: Intact       Cueing       General Comments      Exercises     Assessment/Plan    PT Assessment Patient needs continued PT services  PT Problem List Decreased strength;Decreased activity tolerance;Decreased balance;Decreased mobility;Pain       PT Treatment Interventions DME instruction;Gait training;Functional mobility training;Therapeutic activities;Therapeutic exercise;Balance training;Patient/family education    PT Goals (Current goals can be found in the Care Plan section)  Acute Rehab PT Goals Patient Stated Goal: agreeable to rehab PT Goal Formulation: With patient Time For Goal Achievement: 04/04/23 Potential to Achieve Goals: Good    Frequency Min 3X/week     Co-evaluation               AM-PAC PT "6 Clicks" Mobility  Outcome Measure Help needed turning from your back to your side while in a flat bed without using bedrails?: A Little Help needed moving from lying on your back to sitting on the side of a flat bed without using bedrails?: A Little Help needed moving to and from a bed to a chair (including a wheelchair)?: A  Little Help needed standing up from a chair using your arms (e.g., wheelchair or bedside chair)?: A Little Help needed to walk in hospital room?: Total Help needed climbing 3-5 steps with a railing? : Total 6 Click Score: 14    End of Session Equipment Utilized During Treatment: Gait belt;Other (comment) (LLE air cast, R CAM boot) Activity Tolerance: Patient tolerated treatment well Patient left: in bed;with call bell/phone within reach   PT Visit Diagnosis: Pain;Difficulty in walking, not elsewhere classified (R26.2) Pain - Right/Left: Right Pain - part of body: Ankle and joints of foot    Time: 1324-4010 PT Time Calculation (min) (ACUTE ONLY): 37 min   Charges:   PT Evaluation $PT Eval Low Complexity: 1 Low PT Treatments $Therapeutic Activity: 8-22 mins PT General Charges $$ ACUTE PT VISIT: 1 Visit         Lillia Pauls, PT, DPT Acute Rehabilitation Services Office [phone]   Norval Morton 03/21/2023, 4:25 PM

## 2023-03-21 NOTE — ED Provider Notes (Signed)
   ED Course / MDM   Clinical Course as of 03/21/23 1546  Wed Mar 21, 2023  1222 Reviewed case with orthopedics on-call PA Lin Givens.  He is recommending Aircast on left, cam boot on right, weightbearing as tolerated.  Patient updated on this plan. [MB]  1231 I have placed consult into physical therapy and social work as I am not sure she is going to be able to safely return home at this point. [MB]  1544 Received sign out pending admission.  Discussed with internal medicine teaching service, they will admit the patient given that she has limited help at home (demented husband) and mobility severely limited by bilateral lower extremity fractures.  Patient is agreeable with this plan [WS]    Clinical Course User Index [MB] Terrilee Files, MD [WS] Lonell Grandchild, MD   Medical Decision Making Amount and/or Complexity of Data Reviewed Labs: ordered. Radiology: ordered.  Risk Prescription drug management.         Lonell Grandchild, MD 03/21/23 [phone]

## 2023-03-21 NOTE — Discharge Planning (Signed)
RNCM consulted regarding safe discharge planning (Home with Home Health vs Skilled Nursing Facility Placement).  Physical Therapy evaluation placed; will follow up after recommendations from PT.     

## 2023-03-21 NOTE — Hospital Course (Addendum)
 [name] is a 68 year old female with a past medical history of fibromyalgia, bipolar disease, atrial fibrillation on Eliquis, hypertension, hyperlipidemia who presented to [facility] ED via EMS on 03/21/23 after experiencing a fall from standing 3/4 at her home and was admitted for bilateral ankle fractures of the R fibula and L medial malleolus.   Ankle fractures of R distal fibula and L medial malleolus Fall from Standing This patient experienced a fall from standing at home.  There was not syncope, head trauma, chest pain.  Appears she lost balance going from bed to bathroom around midnight. Unfortunately she was therafter bedbound and unable to care for herself  In hospital, she had splints placed and worked with physical therapy.  Her injury does not require surgery. She does need SNF for recovery.  Pain was controlled.  Examination did not suggest any other occult injuries.  Knee XR and CXR unrevealing.  - Dilaudid pain control - MiraLAX and Senokot for bowel regimen - She will follow up with [employer]. Instructions provided for contact to set up appointment.   Hypotension, Orthostatic Hypotension Sinus bradycardia MAP 55 on first morning of stay but she was oriented, warm, asymptomatic. When working with OT, she had mild orthostatic symptoms and orthostatic hypotension. HR did not appear to increase as would be expected. Bps not low at admission, but possible sympathetic response that wore off led to BP issues. Query medications effect per above, previously necessary regiment may now have changed and impacted her. She requires many medicines that could contribute to this. Certainly her pain treatment with dilaudid might contributed, and so that was minimized here. Further in hospital, her metoprolol was held and diltiazem reduced to half. She received 3L fluids with very mild elevated lactic acid to 2 that resolved. Will discharge with changes per below.  - hold Metoprolol  but resume should HR go aboave 100 and Bps are stable with MAPs well above 65. Reduce and continue Cardizem to 120 qd   Concern for polypharmacy Chronic Fatigue Pt reports severe fatigue daily that impacts day-to-day, for example unable/willing to leave her home since Christmas. She was also hypotensive and bradycardic per above. Her complex medical status of psychiatric disease, fibromyalgia, possible chronic fatigue certainly play a role. However her regimen of tizanidine, xanax, high dose CCB/BB could also contribute.  - Checked TSH, it was normal - med changes per above - would revisit use of xanax, tizanidine as outpatient  AKI AGMA AKI at admission stabilized with fluids. Likely prerenal given pre-hospital bed-bound state with reduced intake and hypotension. AG closed with fluids. A BMP can be collected at follow up to confirm improvement. Bladder scan confirmed no retention.   Atrial Fibrillation Hypertension Sinus Bradycardia Stable while here. - Med changes per above. - Eliquis 5 BID increased from qd   Chronic conditions  Bipolar disease Depression and anxiety Continue celexa 40 every day Continue xanax 1mg  at bedtime    Fibromyalgia Resume home tizanidine 12 mg nightly   Hyperlipidemia Resume home Crestor 10 daily   Hypothyroidism Synthroid 50 daily   Bladder Incontinence Continue home fesoterodine 4 qd

## 2023-03-21 NOTE — Progress Notes (Signed)
 Orthopedic Tech Progress Note Patient Details:  Connie Jackson 03/25/1955 [id number]  Ortho Devices Type of Ortho Device: Ankle Air splint, CAM walker Ortho Device/Splint Location: LLE, RLE Ortho Device/Splint Interventions: Ordered, Application, Adjustment   Post Interventions Patient Tolerated: Fair Instructions Provided: Care of device  Donald Pore 03/21/2023, 1:05 PM

## 2023-03-21 NOTE — ED Notes (Signed)
 Patient went to xray

## 2023-03-21 NOTE — H&P (Signed)
 Date: 03/21/2023               Patient Name:  Connie Jackson MRN: [id number]  DOB: 03-Aug-1955 Age / Sex: 68 y.o., female   PCP: Paulina Fusi, MD         Medical Service: Internal Medicine Teaching Service         Attending Physician: Dr. Mayford Knife, Dorene Ar, MD      First Contact: Dr. Katheran James, DO Pager [phone]    Second Contact: Dr. Rudene Christians, DO Pager [phone]         After Hours (After 5p/  First Contact Pager: [phone]  weekends / holidays): Second Contact Pager: [phone]   SUBJECTIVE   Chief Complaint: Fall from standing  History of Present Illness:  [name] is a 68 year old female with a past medical history of fibromyalgia, bipolar disease, atrial fibrillation on Eliquis, hypertension, hyperlipidemia who presents to [facility], ED via EMS after experiencing a fall from standing yesterday at her home.  She states she left her bed around midnight on Tuesday morning and subsequently fell, noting that her legs simply gave out.  She is certain that she did not hit her head or lose consciousness.  Thereafter she attempted to stand up and go to the bathroom but was unable to bear weight to do this.  She has been bedbound since this accident, noting that she has been unable to eat and unable to make it to the bathroom, unfortunately having to void into the bed.  Presently she reports 5 out of 10 pain in her ankles, with pain worse on the right.  She does not have acute pain elsewhere including the knees, hip, upper body.  She does have chronic pain all over due to her fibromyalgia.  She does not have headache, chest pain, shortness of breath.  She reports reduced functional status since before her accident.  He is very easily fatigued, simply preparing for the day leaves her very tired.  She has not left her home since Christmas, nor has she showered.  She has [employer] deliver necessities to her home.  ED Course: In ED BMP revealed a mild anion gap  acidosis with gap 16 and bicarb 21.  And was elevated to 1.19 although most recent creatinine was 0.82.  No acute electrolyte abnormalities.  There is mild leukocytosis of 13.3.  There is no anemia.  X-rays of the bilateral ankles reveal distal fibular fracture of the right ankle and avulsion fracture of the distal medial malleolus on the left ankle.  X-rays of the knees and chest are nonacute.  Pain control was provided with morphine.  The PDX was consulted and this is a nonoperative injury but the ankles were placed in splints.  Medicine and PT were consulted for admission given concern that patient's functional status would necessitate SNF.  Past Medical History Past Medical History:  Diagnosis Date   Asthma    Atrial fibrillation (HCC)    Depression    Fibromyalgia    Hyperlipidemia    Hypertension    IBS (irritable bowel syndrome)    Migraine    Nephrolithiasis      Meds:  Current Meds  Medication Sig   ALPRAZolam (XANAX) 0.5 MG tablet Take 0.5-1 mg by mouth at bedtime.   citalopram (CELEXA) 40 MG tablet Take 40 mg by mouth daily.   diltiazem (TIAZAC) 240 MG 24 hr capsule Take 240 mg by mouth daily.   eletriptan (RELPAX) 40 MG tablet  Take 40 mg by mouth as needed for migraine or headache.   ELIQUIS 5 MG TABS tablet Take 5 mg by mouth daily.   metoprolol succinate (TOPROL-XL) 100 MG 24 hr tablet Take 100 mg by mouth at bedtime.   rosuvastatin (CRESTOR) 10 MG tablet Take 10 mg by mouth at bedtime.   solifenacin (VESICARE) 5 MG tablet SMARTSIG:1 Tablet(s) By Mouth Every Evening   SYNTHROID 50 MCG tablet Take 50 mcg by mouth every morning.   tiZANidine (ZANAFLEX) 4 MG tablet Take 3 tablets by mouth at bedtime. 1 during the day if needed   [DISCONTINUED] citalopram (CELEXA) 20 MG tablet Take 20 mg by mouth daily.   [DISCONTINUED] diltiazem (CARDIZEM CD) 240 MG 24 hr capsule Take 1 capsule by mouth daily.    Past Surgical History Past Surgical History:  Procedure Laterality Date    ABDOMINAL HYSTERECTOMY     APPENDECTOMY     CESAREAN SECTION     x 2   CHOLECYSTECTOMY     OOPHORECTOMY     TONSILLECTOMY     TUBAL LIGATION      Social:  Lives in [place] with husband Occupation: Retired Support: Husband who has dementia. Son and Daughter live in [place] and [place], [place]. Level of Function: Limited in I/ADLs due to chronic severe fatigue.  She does not drive because she is embarrassed about this out of her car having had his [employer] hardware removed.  She does not believe the home or bathe, which she attributes to her severe fatigue.  She has trouble maintaining her home or grooming due to fatigue.  She does not typically have trouble walking. PCP: Paulina Fusi, MD Substances: No smoking, alcohol, substances.  Family History:  Noncontributory  Allergies: Allergies as of 03/21/2023 - Review Complete 03/21/2023  Allergen Reaction Noted   Dht [dihydrotachysterol] Dermatitis 08/18/2011   Penicillins Other (See Comments) 12/05/2010   Promethazine hcl  12/05/2010   Sulfa antibiotics  12/05/2010    Review of Systems: A complete ROS was negative except as per HPI.   OBJECTIVE:   Physical Exam: Blood pressure 122/81, pulse (!) 58, temperature 98.1 F (36.7 C), resp. rate 18, height 5\' 7"  (1.702 m), weight 104.3 kg, SpO2 95%.  Constitutional: Well appearing female in bed in no acute distress. HENT: Normocephalic, atraumatic,  Eyes: Sclera non-icteric, PERRL, EOM intact Neck:normal atraumatic, no jvd Cardio:Regular rate and rhythm. No murmurs, rubs, or gallops. 2+ bilateral radial and dorsalis pedis  pulses.  Extremities are warm. Pulm:Clear to auscultation bilaterally. Normal work of breathing on room air. Abdomen: Soft, non-tender, non-distended, positive bowel sounds. AVW:[id number] for extremity edema.  Bilateral ankles are in splints.  There is diffuse tenderness to palpation but the feet are well-perfused.  There is no point tenderness or  acute swelling to the knees or hips or upper extremities. Skin:Warm and dry. Neuro:Alert and oriented x3. No focal deficit noted. Psych:Pleasant mood and affect.  Labs: CBC    Component Value Date/Time   WBC 13.3 (H) 03/21/2023 1030   RBC 4.18 03/21/2023 1030   HGB 13.0 03/21/2023 1030   HCT 40.0 03/21/2023 1030   PLT 318 03/21/2023 1030   MCV 95.7 03/21/2023 1030   MCH 31.1 03/21/2023 1030   MCHC 32.5 03/21/2023 1030   RDW 13.5 03/21/2023 1030   LYMPHSABS 1.6 03/21/2023 1030   MONOABS 0.9 03/21/2023 1030   EOSABS 0.4 03/21/2023 1030   BASOSABS 0.0 03/21/2023 1030     CMP     Component Value  Date/Time   NA 137 03/21/2023 1030   K 3.9 03/21/2023 1030   CL 100 03/21/2023 1030   CO2 21 (L) 03/21/2023 1030   GLUCOSE 118 (H) 03/21/2023 1030   BUN 12 03/21/2023 1030   CREATININE 1.19 (H) 03/21/2023 1030   CALCIUM 9.2 03/21/2023 1030   PROT 6.5 08/19/2011 0640   ALBUMIN 3.5 08/19/2011 0640   AST 19 08/19/2011 0640   ALT 13 08/19/2011 0640   ALKPHOS 89 08/19/2011 0640   BILITOT 0.3 08/19/2011 0640   GFRNONAA 50 (L) 03/21/2023 1030   GFRAA >90 08/19/2011 0640    Imaging:  R ankle XR: Acute oblique fracture of the distal fibular metadiaphysis extending to the level of the distal tibiofibular syndesmosis.  L ankle XR: Suspected tiny acute avulsion injury of the distal tip of the medial malleolus. Recommend correlation for point tenderness.  Bilateral knee XR without fracture, notes osteoarthritis.  CXR no active diseae. Moderate cardiomegaly.  EKG: personally reviewed my interpretation is sinus bradycardia and R BBB. Most recent prior EKG is 2013 that showed RBBB but no bradycardia.  ASSESSMENT & PLAN:   Assessment & Plan by Problem: Principal Problem:   Fibula fracture  Ms. Cieara Stierwalt is a 68 year old female with a past medical history of fibromyalgia, bipolar disease, atrial fibrillation on Eliquis, hypertension, hyperlipidemia who presents to [facility]  ED via EMS after experiencing a fall from standing yesterday at her home and is admitted for bilateral ankle fractures - R fibula and L medial malleolus.  Ankle fractures of R distal fibula and L medial malleolus Fall from Standing This patient experienced a fall from standing at home yesterday morning.  There was not surrounding syncope, head trauma, chest pain.  Unfortunately she has been bedbound and unable to care for herself since this injury.  He has splints in place and has already been evaluated by physical therapy.  Most likely she does not have enough support at home in order to discharge, given her bilateral injury.  SNF is probably appropriate for her and will pursue that.  In the meantime will do best to control her pain.  Examination does not suggest any other occult injuries.  Notably chest and knee imaging were unremarkable.  The hips and upper extremities do not appear to have any acute tenderness or swelling. -Percocet 5-325 Q4 as needed for pain -MiraLAX and Senokot for bowel regimen  AKI AGMA Possible AKI with Cr 1.2 albeit no recent Cr for comparison. AGMA 16, Bicarb 21. Will monitor on BMP, check lactic acid.  Atrial Fibrillation Hypertension Sinus Bradycardia - Continue home metoprolol succinate 100 daily.  Notably she does have sinus bradycardia today, wonder if this medicine is needed. Will monitor and adjust as needed - Continue eliquis 5 BID - condinue diltiazem 240 every day   Bipolar disease Depression and anxiety Continue celexa 40 every day Continue xanax 1mg  at bedtime   Fibromyalgia Resume home tizanidine 12 mg nightly  Hyperlipidemia Resume home Crestor 10 daily  Hypothyroidism Synthroid 50 daily  Bladder Incontinence Continue home fesoterodine 4 qd  Diet: Normal VTE: DOAC IVF: None,None Code: DNR/DNI  Dispo: Admit patient to Inpatient with expected length of stay greater than 2 midnights.  Signed: Katheran James, DO Dr. Katheran James, DO Pager [phone]  03/21/2023, 4:58 PM

## 2023-03-21 NOTE — ED Provider Notes (Signed)
 [facility] EMERGENCY DEPARTMENT AT [facility] Provider Note   CSN: [id number] Arrival date & time: 03/21/23  4098     History  Chief Complaint  Patient presents with   Connie Jackson is a 68 y.o. female.  He has a history of on Eliquis, fibromyalgia.  States that she fell 2 days ago, her knees gave out and fell twisted her right ankle.  Denies hitting her head or neck no loss of consciousness.  She was able to get up with assistance and ambulate back to bed but since then she has been unable to walk.  Usually ambulates independently.  Complaining of severe pain in both of her knees in both her ankles although right ankle seems to be the worst.  She was given some inhalational pain relief by EMS for transport.  Her oxygen saturations were low and so she was placed on oxygen.  She said she has had chronic respiratory issues since hospitalization for pneumonia 2 years ago and has oxygen at home but she does not usually use it.  She is on chronic pain medicine for her fibromyalgia.  She currently rates her leg pain as 11 out of 10.  The history is provided by the patient.  Fall This is a recurrent problem. The current episode started more than 2 days ago. The problem has not changed since onset.Pertinent negatives include no chest pain, no abdominal pain, no headaches and no shortness of breath. The symptoms are aggravated by bending and twisting. Nothing relieves the symptoms. She has tried rest for the symptoms. The treatment provided no relief.       Home Medications Prior to Admission medications   Medication Sig Start Date End Date Taking? Authorizing Provider  ALPRAZolam Prudy Feeler) 1 MG tablet Take 2 tablets by mouth at bedtime. 08/28/13   [provider]  Biotin 11914 MCG TABS Take 10,000 mcg by mouth daily.    [provider]  cetirizine (ZYRTEC) 10 MG tablet Take 10 mg by mouth at bedtime.    [provider]  citalopram (CELEXA) 20 MG  tablet  05/30/17   [provider]  diltiazem (CARDIZEM CD) 240 MG 24 hr capsule Take 1 capsule by mouth daily. 11/07/13   [provider]  eletriptan (RELPAX) 40 MG tablet One tablet by mouth as needed for migraine headache.  If the headache improves and then returns, dose may be repeated after 2 hours have elapsed since first dose (do not exceed 80 mg per day). may repeat in 2 hours if necessary     [provider]  ELIQUIS 5 MG TABS tablet  05/30/17   [provider]  metoprolol tartrate (LOPRESSOR) 25 MG tablet Take 1 tablet (25 mg total) by mouth at bedtime. 08/20/11   Richarda Overlie, MD  Mometasone Furoate [facility] HFA) 100 MCG/ACT AERO Inhale 2 puffs into the lungs 2 (two) times daily. 10/02/17   Glenford Bayley, NP  montelukast (SINGULAIR) 10 MG tablet TAKE 1 TABLET BY MOUTH AT BEDTIME. 09/24/18   Glenford Bayley, NP  rosuvastatin (CRESTOR) 10 MG tablet  04/26/17   [provider]  tiZANidine (ZANAFLEX) 4 MG tablet Take 3 tablets by mouth at bedtime. 1 during the day if needed 10/20/13   [provider]  traMADol (ULTRAM) 50 MG tablet Take by mouth every 6 (six) hours as needed.    [provider]      Allergies    Dht [dihydrotachysterol], Penicillins,  Promethazine hcl, and Sulfa antibiotics    Review of Systems   Review of Systems  Constitutional:  Negative for fever.  Respiratory:  Negative for shortness of breath.   Cardiovascular:  Negative for chest pain.  Gastrointestinal:  Negative for abdominal pain.  Musculoskeletal:  Negative for neck pain.  Neurological:  Negative for headaches.    Physical Exam Updated Vital Signs BP 121/70 (BP Location: Right Arm)   Pulse 60   Temp 98 F (36.7 C) (Oral)   Resp 16   Ht 5\' 7"  (1.702 m)   Wt 104.3 kg   SpO2 (!) 85%   BMI 36.02 kg/m  Physical Exam Vitals and nursing note reviewed.  Constitutional:      General: She is not in acute distress.    Appearance: Normal  appearance. She is well-developed.  HENT:     Head: Normocephalic and atraumatic.  Eyes:     Conjunctiva/sclera: Conjunctivae normal.  Cardiovascular:     Rate and Rhythm: Normal rate and regular rhythm.     Heart sounds: No murmur heard. Pulmonary:     Effort: Pulmonary effort is normal. No respiratory distress.     Breath sounds: Normal breath sounds.  Abdominal:     Palpations: Abdomen is soft.     Tenderness: There is no abdominal tenderness. There is no guarding or rebound.  Musculoskeletal:        General: Tenderness and signs of injury present. No deformity.     Cervical back: Neck supple.     Comments: She is diffusely tender both knees and both ankles.  She has limited range of motion secondary to pain.  There is no significant swelling or bruising.  Distal pulses motor and sensation intact.  Skin:    General: Skin is warm and dry.     Capillary Refill: Capillary refill takes less than 2 seconds.  Neurological:     General: No focal deficit present.     Mental Status: She is alert.     Sensory: No sensory deficit.     Motor: No weakness.     ED Results / Procedures / Treatments   Labs (all labs ordered are listed, but only abnormal results are displayed) Labs Reviewed  BASIC METABOLIC PANEL - Abnormal; Notable for the following components:      Result Value   CO2 21 (*)    Glucose, Bld 118 (*)    Creatinine, Ser 1.19 (*)    GFR, Estimated 50 (*)    Anion gap 16 (*)    All other components within normal limits  CBC WITH DIFFERENTIAL/PLATELET - Abnormal; Notable for the following components:   WBC 13.3 (*)    Neutro Abs 10.2 (*)    All other components within normal limits  LACTIC ACID, PLASMA  LACTIC ACID, PLASMA  BASIC METABOLIC PANEL  CBC  HIV ANTIBODY (ROUTINE TESTING W REFLEX)    EKG EKG Interpretation Date/Time:  Wednesday March 21 2023 09:35:42 EST Ventricular Rate:  47 PR Interval:  198 QRS Duration:  126 QT Interval:  522 QTC  Calculation: 462 R Axis:   11  Text Interpretation: Sinus bradycardia Right bundle branch block Left ventricular hypertrophy rate slower than prior 8/13 Confirmed by Meridee Score [phone]) on 03/21/2023 9:54:52 AM  Radiology DG Chest 1 View Result Date: 03/21/2023 CLINICAL DATA:  Fall unable to bear weight. EXAM: CHEST  1 VIEW COMPARISON:  Chest radiograph 08/11/2022 and 02/10/2021 FINDINGS: Cardiac silhouette is again moderately enlarged. Mediastinal contours are within  limits. The lungs are clear. No pleural effusion pneumothorax. Mild dextrocurvature of the mid to lower thoracic spine with moderate multilevel degenerative disc changes. IMPRESSION: 1. No active disease. 2. Moderate cardiomegaly. Electronically Signed   By: Neita Garnet M.D.   On: 03/21/2023 11:58   DG Ankle Complete Right Result Date: 03/21/2023 CLINICAL DATA:  Fall.  Pain. EXAM: RIGHT ANKLE - COMPLETE 3+ VIEW COMPARISON:  None Available. FINDINGS: There is diffuse decreased bone mineralization. Mild tibiotalar joint space narrowing. The ankle mortise is symmetric and intact. Mild dorsal talonavicular and mild dorsal tarsometatarsal degenerative osteophytosis. Mild-to-moderate plantar calcaneal heel spur. Best seen on oblique view, there is an oblique acute fracture of the distal fibular metadiaphysis extending to the level of the distal tibiofibular syndesmosis. Minimal 1 mm cortical step-off of the lateral cortex on oblique view but otherwise no displacement. Mild-to-moderate lateral malleolar soft tissue swelling. IMPRESSION: Acute oblique fracture of the distal fibular metadiaphysis extending to the level of the distal tibiofibular syndesmosis. Electronically Signed   By: Neita Garnet M.D.   On: 03/21/2023 11:56   DG Ankle Complete Left Result Date: 03/21/2023 CLINICAL DATA:  Fall.  Pain. EXAM: LEFT ANKLE COMPLETE - 3+ VIEW COMPARISON:  None Available. FINDINGS: There is diffuse decreased bone mineralization. There is mild  transverse linear lucency separating the distal 2 mm tip of the medial malleolus by less than 1 mm, suspicious for a tiny avulsion injury. There is mild overlying medial ankle soft tissue swelling. The ankle mortise is symmetric and intact. Moderate plantar calcaneal heel spur. Mild talonavicular and navicular-cuneiform joint space narrowing. IMPRESSION: Suspected tiny acute avulsion injury of the distal tip of the medial malleolus. Recommend correlation for point tenderness. Electronically Signed   By: Neita Garnet M.D.   On: 03/21/2023 11:54   DG Knee Complete 4 Views Right Result Date: 03/21/2023 CLINICAL DATA:  Fall.  Pain. EXAM: RIGHT KNEE - COMPLETE 4+ VIEW COMPARISON:  None Available. FINDINGS: There is diffuse decreased bone mineralization. Severe patellofemoral joint space narrowing and peripheral osteophytosis. Mild medial and lateral compartment joint space narrowing and peripheral osteophytosis. Mild degenerative irregularity of the weight-bearing medial femoral condyle articular surface. No significant joint effusion. No acute fracture is seen. No dislocation. IMPRESSION: Severe patellofemoral and mild medial and lateral compartment osteoarthritis. No acute fracture is seen. Electronically Signed   By: Neita Garnet M.D.   On: 03/21/2023 11:51   DG Knee Complete 4 Views Left Result Date: 03/21/2023 CLINICAL DATA:  Fall.  Pain.  Unable to bear weight. EXAM: LEFT KNEE - COMPLETE 4+ VIEW COMPARISON:  None Available. FINDINGS: There is diffuse decreased bone mineralization. Severe patellofemoral joint space narrowing and peripheral osteophytosis. Tiny joint effusion. Mild medial and lateral compartment joint space narrowing and peripheral osteophytosis. No acute fracture or dislocation. IMPRESSION: 1. Severe patellofemoral and mild medial and lateral compartment osteoarthritis. 2. Tiny joint effusion. 3. No acute fracture is seen. Electronically Signed   By: Neita Garnet M.D.   On: 03/21/2023 11:49     Procedures Procedures    Medications Ordered in ED Medications  ALPRAZolam (XANAX) tablet 0.5-1 mg (has no administration in time range)  citalopram (CELEXA) tablet 40 mg (40 mg Oral Given 03/21/23 1358)  diltiazem (CARDIZEM CD) 24 hr capsule 240 mg (240 mg Oral Given 03/21/23 1357)  apixaban (ELIQUIS) tablet 5 mg (5 mg Oral Given 03/21/23 1358)  metoprolol succinate (TOPROL-XL) 24 hr tablet 100 mg (has no administration in time range)  rosuvastatin (CRESTOR) tablet 10 mg (has  no administration in time range)  fesoterodine (TOVIAZ) tablet 4 mg (4 mg Oral Given 03/21/23 1358)  levothyroxine (SYNTHROID) tablet 50 mcg (50 mcg Oral Given 03/21/23 1358)  tiZANidine (ZANAFLEX) tablet 12 mg (has no administration in time range)  oxyCODONE-acetaminophen (PERCOCET/ROXICET) 5-325 MG per tablet 1 tablet (1 tablet Oral Given 03/21/23 1421)  morphine (PF) 4 MG/ML injection 4 mg (4 mg Intravenous Given 03/21/23 1050)    ED Course/ Medical Decision Making/ A&P Clinical Course as of 03/21/23 1702  Wed Mar 21, 2023  1222 Reviewed case with orthopedics on-call PA Lin Givens.  He is recommending Aircast on left, cam boot on right, weightbearing as tolerated.  Patient updated on this plan. [MB]  1231 I have placed consult into physical therapy and social work as I am not sure she is going to be able to safely return home at this point. [MB]  1544 Received sign out pending admission.  Discussed with internal medicine teaching service, they will admit the patient given that she has limited help at home (demented husband) and mobility severely limited by bilateral lower extremity fractures.  Patient is agreeable with this plan [WS]    Clinical Course User Index [MB] Terrilee Files, MD [WS] Lonell Grandchild, MD                                 Medical Decision Making Amount and/or Complexity of Data Reviewed Labs: ordered. Radiology: ordered.  Risk Prescription drug management. Decision regarding  hospitalization.   This patient complains of pain in both of her knees and ankles after a fall; this involves an extensive number of treatment Options and is a complaint that carries with it a high risk of complications and morbidity. The differential includes fracture, contusion, ligament injury, dislocation,  I ordered, reviewed and interpreted labs, which included CBC with elevated white count stable hemoglobin, chemistries fairly baseline I ordered medication intravenous pain medication and reviewed PMP when indicated. I ordered imaging studies which included x-rays of chest bilateral ankles bilateral knees and I independently    visualized and interpreted imaging which showed fracture of right and left ankle Additional history obtained from EMS Previous records obtained and reviewed in epic including recent urology notes I consulted orthopedics Margarette Asal and social work physical therapy and discussed lab and imaging findings and discussed disposition.  Cardiac monitoring reviewed, sinus bradycardia Social determinants considered, no significant barriers Critical Interventions: None  After the interventions stated above, I reevaluated the patient and found patient hemodynamically stable although in significant pain with any type of range of motion Admission and further testing considered, patient will likely need admission versus placement.  Do not feel she can safely return home.  Signed out to Dr. Rhoderick Moody to follow-up on results of physical therapy evaluation and see if placement is an option.  Otherwise she may need admission to the hospital.         Final Clinical Impression(s) / ED Diagnoses Final diagnoses:  Closed nondisplaced fracture of lateral malleolus of right fibula, initial encounter  Closed nondisplaced fracture of medial malleolus of left tibia, initial encounter  Acute pain of both knees    Rx / DC Orders ED Discharge Orders     None          Terrilee Files, MD 03/21/23 1705

## 2023-03-21 NOTE — ED Triage Notes (Addendum)
 Pt to ED via [facility] from home. Pt fell 2 days ago, hurt her right ankle, and has not been able to get out of the bed since then. Pt states she also feels like she has an UTI. Pt states her legs gave out on her when she fell. Pt takes eliquis, denies hitting her head or LOC. Pt received nitrous oxide en route. Pt 02 sat = 85%, pt states this is normal for her and she has 02 at home but does not wear it.

## 2023-03-22 DIAGNOSIS — Y92009 Unspecified place in unspecified non-institutional (private) residence as the place of occurrence of the external cause: Secondary | ICD-10-CM | POA: Diagnosis not present

## 2023-03-22 DIAGNOSIS — R531 Weakness: Secondary | ICD-10-CM | POA: Diagnosis not present

## 2023-03-22 DIAGNOSIS — S8264XA Nondisplaced fracture of lateral malleolus of right fibula, initial encounter for closed fracture: Secondary | ICD-10-CM | POA: Diagnosis not present

## 2023-03-22 DIAGNOSIS — I951 Orthostatic hypotension: Secondary | ICD-10-CM | POA: Diagnosis not present

## 2023-03-22 DIAGNOSIS — Z7401 Bed confinement status: Secondary | ICD-10-CM | POA: Diagnosis not present

## 2023-03-22 DIAGNOSIS — Z66 Do not resuscitate: Secondary | ICD-10-CM | POA: Diagnosis not present

## 2023-03-22 DIAGNOSIS — R2689 Other abnormalities of gait and mobility: Secondary | ICD-10-CM | POA: Diagnosis not present

## 2023-03-22 DIAGNOSIS — Z79899 Other long term (current) drug therapy: Secondary | ICD-10-CM | POA: Diagnosis not present

## 2023-03-22 DIAGNOSIS — Z882 Allergy status to sulfonamides status: Secondary | ICD-10-CM | POA: Diagnosis not present

## 2023-03-22 DIAGNOSIS — W1830XA Fall on same level, unspecified, initial encounter: Secondary | ICD-10-CM | POA: Diagnosis not present

## 2023-03-22 DIAGNOSIS — I4891 Unspecified atrial fibrillation: Secondary | ICD-10-CM | POA: Diagnosis not present

## 2023-03-22 DIAGNOSIS — I10 Essential (primary) hypertension: Secondary | ICD-10-CM | POA: Diagnosis not present

## 2023-03-22 DIAGNOSIS — K5900 Constipation, unspecified: Secondary | ICD-10-CM | POA: Diagnosis not present

## 2023-03-22 DIAGNOSIS — E785 Hyperlipidemia, unspecified: Secondary | ICD-10-CM | POA: Diagnosis not present

## 2023-03-22 DIAGNOSIS — E872 Acidosis, unspecified: Secondary | ICD-10-CM | POA: Diagnosis not present

## 2023-03-22 DIAGNOSIS — E039 Hypothyroidism, unspecified: Secondary | ICD-10-CM | POA: Diagnosis not present

## 2023-03-22 DIAGNOSIS — D72829 Elevated white blood cell count, unspecified: Secondary | ICD-10-CM | POA: Diagnosis not present

## 2023-03-22 DIAGNOSIS — S8252XD Displaced fracture of medial malleolus of left tibia, subsequent encounter for closed fracture with routine healing: Secondary | ICD-10-CM | POA: Diagnosis not present

## 2023-03-22 DIAGNOSIS — S82409A Unspecified fracture of shaft of unspecified fibula, initial encounter for closed fracture: Secondary | ICD-10-CM | POA: Diagnosis not present

## 2023-03-22 DIAGNOSIS — S82832A Other fracture of upper and lower end of left fibula, initial encounter for closed fracture: Secondary | ICD-10-CM | POA: Diagnosis not present

## 2023-03-22 DIAGNOSIS — F319 Bipolar disorder, unspecified: Secondary | ICD-10-CM | POA: Diagnosis not present

## 2023-03-22 DIAGNOSIS — R001 Bradycardia, unspecified: Secondary | ICD-10-CM | POA: Diagnosis present

## 2023-03-22 DIAGNOSIS — Z888 Allergy status to other drugs, medicaments and biological substances status: Secondary | ICD-10-CM | POA: Diagnosis not present

## 2023-03-22 DIAGNOSIS — S8255XA Nondisplaced fracture of medial malleolus of left tibia, initial encounter for closed fracture: Secondary | ICD-10-CM | POA: Diagnosis not present

## 2023-03-22 DIAGNOSIS — R2681 Unsteadiness on feet: Secondary | ICD-10-CM | POA: Diagnosis not present

## 2023-03-22 DIAGNOSIS — Z7901 Long term (current) use of anticoagulants: Secondary | ICD-10-CM | POA: Diagnosis not present

## 2023-03-22 DIAGNOSIS — M810 Age-related osteoporosis without current pathological fracture: Secondary | ICD-10-CM | POA: Diagnosis not present

## 2023-03-22 DIAGNOSIS — M797 Fibromyalgia: Secondary | ICD-10-CM | POA: Diagnosis not present

## 2023-03-22 DIAGNOSIS — E669 Obesity, unspecified: Secondary | ICD-10-CM | POA: Diagnosis not present

## 2023-03-22 DIAGNOSIS — Z880 Allergy status to penicillin: Secondary | ICD-10-CM | POA: Diagnosis not present

## 2023-03-22 DIAGNOSIS — S8261XD Displaced fracture of lateral malleolus of right fibula, subsequent encounter for closed fracture with routine healing: Secondary | ICD-10-CM | POA: Diagnosis not present

## 2023-03-22 DIAGNOSIS — G8929 Other chronic pain: Secondary | ICD-10-CM | POA: Diagnosis not present

## 2023-03-22 DIAGNOSIS — I4510 Unspecified right bundle-branch block: Secondary | ICD-10-CM | POA: Diagnosis not present

## 2023-03-22 DIAGNOSIS — Z743 Need for continuous supervision: Secondary | ICD-10-CM | POA: Diagnosis not present

## 2023-03-22 DIAGNOSIS — N179 Acute kidney failure, unspecified: Secondary | ICD-10-CM | POA: Diagnosis not present

## 2023-03-22 LAB — BASIC METABOLIC PANEL
Anion gap: 8 (ref 5–15)
BUN: 14 mg/dL (ref 8–23)
CO2: 23 mmol/L (ref 22–32)
Calcium: 8.5 mg/dL — ABNORMAL LOW (ref 8.9–10.3)
Chloride: 104 mmol/L (ref 98–111)
Creatinine, Ser: 1.37 mg/dL — ABNORMAL HIGH (ref 0.44–1.00)
GFR, Estimated: 43 mL/min — ABNORMAL LOW (ref >60)
Glucose, Bld: 149 mg/dL — ABNORMAL HIGH (ref 70–99)
Potassium: 4.1 mmol/L (ref 3.5–5.1)
Sodium: 135 mmol/L (ref 135–145)

## 2023-03-22 LAB — CBC
HCT: 31.2 % — ABNORMAL LOW (ref 36.0–46.0)
Hemoglobin: 10.4 g/dL — ABNORMAL LOW (ref 12.0–15.0)
MCH: 31.3 pg (ref 26.0–34.0)
MCHC: 33.3 g/dL (ref 30.0–36.0)
MCV: 94 fL (ref 80.0–100.0)
Platelets: 286 10*3/uL (ref 150–400)
RBC: 3.32 MIL/uL — ABNORMAL LOW (ref 3.87–5.11)
RDW: 13.5 % (ref 11.5–15.5)
WBC: 12.2 10*3/uL — ABNORMAL HIGH (ref 4.0–10.5)
nRBC: 0 % (ref 0.0–0.2)

## 2023-03-22 LAB — HIV ANTIBODY (ROUTINE TESTING W REFLEX): HIV Screen 4th Generation wRfx: NONREACTIVE

## 2023-03-22 LAB — LACTIC ACID, PLASMA: Lactic Acid, Venous: 1.1 mmol/L (ref 0.5–1.9)

## 2023-03-22 LAB — TSH: TSH: 3.651 u[IU]/mL (ref 0.350–4.500)

## 2023-03-22 MED ORDER — METOPROLOL SUCCINATE ER 50 MG PO TB24: 50.0000 mg | ORAL_TABLET | Freq: Every day | ORAL | Status: DC

## 2023-03-22 MED ORDER — TRIAMCINOLONE ACETONIDE 0.1 % EX OINT
TOPICAL_OINTMENT | Freq: Two times a day (BID) | CUTANEOUS | Status: DC
  Filled 2023-03-22: qty 15

## 2023-03-22 MED ORDER — LACTATED RINGERS IV BOLUS
1000.0000 mL | Freq: Once | INTRAVENOUS | Status: AC
  Administered 2023-03-22: 1000 mL via INTRAVENOUS

## 2023-03-22 MED ORDER — DILTIAZEM HCL ER COATED BEADS 120 MG PO CP24
120.0000 mg | ORAL_CAPSULE | Freq: Every day | ORAL | Status: DC
  Administered 2023-03-23: 120 mg via ORAL
  Filled 2023-03-22: qty 1

## 2023-03-22 MED ORDER — HYDROMORPHONE HCL 1 MG/ML IJ SOLN
0.5000 mg | Freq: Once | INTRAMUSCULAR | Status: AC
  Administered 2023-03-22: 0.5 mg via INTRAVENOUS
  Filled 2023-03-22: qty 0.5

## 2023-03-22 NOTE — Progress Notes (Addendum)
 DO, Katheran James notified and aware. Will recheck BP in 20 min. No new orders at this time. Will continue to monitor.   03/22/23 0737  Vitals  BP (!) 65/48  MAP (mmHg) (!) 55  BP Location Left Arm  BP Method Automatic  Patient Position (if appropriate) Lying

## 2023-03-22 NOTE — Care Management Obs Status (Signed)
 MEDICARE OBSERVATION STATUS NOTIFICATION   Patient Details  Name: Connie Jackson MRN: [id number] Date of Birth: 12-Feb-1955   Medicare Observation Status Notification Given:  Yes    Lorri Frederick, LCSW 03/22/2023, 12:18 PM

## 2023-03-22 NOTE — Progress Notes (Signed)
 HD#0 SUBJECTIVE:  Patient Summary: Connie Jackson is a 68 year old female with a past medical history of fibromyalgia, bipolar disease, atrial fibrillation on Eliquis, hypertension, hyperlipidemia who presented to [facility] ED via EMS on 3/5 after experiencing a fall from standing at her home on 3/4. She is admitted for bilateral ankle fractures - R fibula and L medial malleolus - with intention of discharge to SNF.  Overnight Events and Interim History: NEO. Morning blood pressures soft with MAPs around 55 but pt well with mentation intact. She denies acute discomfort, pain is very well managed. She utilized her prn twice mainly for sleep, she states she had trouble sleeping.  Significant Hospital Events:  Admitted 3/5 for bilateral ankle fractures due to fall at home on 3/4  OBJECTIVE:  Vital Signs: Vitals:   03/21/23 2100 03/22/23 0500 03/22/23 0737 03/22/23 0813  BP: (!) 145/65 96/60 (!) 65/48 (!) 76/42  Pulse: 65 (!) 55  (!) 49  Resp: 19 16 16 16   Temp:  98.5 F (36.9 C) 97.8 F (36.6 C) 97.7 F (36.5 C)  TempSrc:  Oral  Oral  SpO2: 95% 92% (!) 87% (!) 87%  Weight:      Height:       Supplemental O2: Room Air SpO2: (!) 87 %  Filed Weights   03/21/23 0857  Weight: 104.3 kg     Intake/Output Summary (Last 24 hours) at 03/22/2023 1610 Last data filed at 03/22/2023 0600 Gross per 24 hour  Intake 320 ml  Output 250 ml  Net 70 ml   Net IO Since Admission: 70 mL [03/22/23 0916]  Physical Exam: Physical Exam Constitutional:      Appearance: She is obese. She is not ill-appearing.  Cardiovascular:     Rate and Rhythm: Normal rate and regular rhythm.     Pulses: Normal pulses.     Heart sounds: Normal heart sounds.  Musculoskeletal:        General: Swelling and tenderness present.     Right lower leg: No edema.     Left lower leg: No edema.     Comments: Mild swelling at fractures in the bilateral ankles. Mild tenderness with movement. Splints in place.  Extremities warm, perfused, clean.  Skin:    General: Skin is warm and dry.     Comments: Scattered excoriations on the upper chest wall, R upper arm, attributed to itching from eczema.  Neurological:     Mental Status: She is alert.  Psychiatric:        Mood and Affect: Mood normal.        Behavior: Behavior normal.     Patient Lines/Drains/Airways Status     Active Line/Drains/Airways     Name Placement date Placement time Site Days   Peripheral IV 03/21/23 20 G Anterior;Proximal;Right Forearm 03/21/23  --  Forearm  1             ASSESSMENT/PLAN:  Assessment: Principal Problem:   Fibula fracture  Connie Jackson is a 68 year old female with a past medical history of fibromyalgia, bipolar disease, atrial fibrillation on Eliquis, hypertension, hyperlipidemia who presented to [facility] ED via EMS on 3/5 after experiencing a fall from standing at her home on 3/4. She is admitted for bilateral ankle fractures - R fibula and L medial malleolus - with intention of discharge to SNF. Today is HD#1.  Plan: Ankle fractures of R distal fibula and L medial malleolus Fall from Standing Mysterious situation of a fall from standing  in the home resulting in bilateral ankle fracture. No history of syncope, head trauma, chest pain, dizziness. She has significant chronic fatigue and takes many sedating medicines at night and has functional impairment at her baseline. Plan for pain control and PT work pending discharge to SNF. - Dilaudid 0.5 Q4 as needed for pain. Sparing use as pt takes xanax regularly. - MiraLAX and Senokot for bowel regimen. Pt states she goes 1-2x/month and most recent 3/3.   Concern for overmedication Chronic Fatigue Pt reports severe fatigue daily that impacts day-to-day, for example unable/willing to leave her home since Christmas. Her medical history of psychiatric disease, fibromyalgia, possible chronic fatigue certainly play a role. However her regimen of  tizanidine, xanax, high dose CCB/BB could also contribute. Notably, she exhibited sinus bradycardia at admission and hypotension overnight. - hold metoprolol - would revisit use of xanax, tizanidine as outpatient - Check TSH  Hypotension, Orthostatic Hypotension Sinus bradycardia MAP 55 this am but pt fully oriented, warm, asymptomatic. Bps not low at admission, but possible sympathetic response now wearing off. Had orthostatic signs on eval today. Furthermore, HR below 60 for majority of hospital stay. Query overmedication per above. Will hold her beta blocker, reduce CCP. She denies symptoms surrounding her fall, but wonder if this is contributing. Will trend BP.  - hold Metoprolol, reduce cardizem to 120, anticipate reduced doses at discharge - LR 1L   AKI AGMA - resolved Cr worsened to 1.37 this am. Although no baseline to compare do feel this is a true AKI given worsening and her debility at home. Gap has closed.  - Fluids  Eczema Mild eczematous dermatitis causing itching and excoriations. Predominantly upper arms and chest wall. - Offer triamcinolone 0.1% ointment  Atrial Fibrillation Hx Hypertension - Per above will hold home metoprolol succinate and reduce diltiazem to 120 qd. Diltiazem was held this morning. - Continue eliquis 5 BID. She had been taking it every day, a misunderstanding.   Bipolar disease Depression and anxiety Continue celexa 40 every day Continue xanax 1mg  at bedtime    Fibromyalgia Continue home tizanidine 12 mg nightly   Hyperlipidemia Continue home Crestor 10 daily   Hypothyroidism Synthroid 50 daily - Check TSH per fatigue   Bladder Incontinence Continue home fesoterodine 4 qd  Best Practice: Diet: Regular diet IVF: Fluids: LR bolus VTE: DOAC Code: DNR/DNI AB: None Pain Medicine: Percocet Bowel Regimen: Miralax, Senna-docusate Therapy Recs: SNF, DME: none DISPO: Anticipated discharge in 1-3 days to Skilled nursing facility pending   placement .  Signature: Katheran James, D.O.  Internal Medicine Resident, PGY-1 [facility]  Pager: [phone] 9:16 AM, 03/22/2023   Please contact the on call pager after 5 pm and on weekends at [phone].

## 2023-03-22 NOTE — Progress Notes (Signed)
 V/S rechecked per MD request. Pt c/o ankle pain.Per pt, she can not take tylenol because it upsets her stomach. MD recommends supportive pain management due to soft blood pressure.    03/22/23 1951  Vitals  Temp 98.9 F (37.2 C)  Temp Source Oral  BP 107/73  MAP (mmHg) 84  BP Location Left Arm  BP Method Automatic  Patient Position (if appropriate) Lying  Pulse Rate 71  Pulse Rate Source Dinamap  Resp 18  Level of Consciousness  Level of Consciousness Alert  MEWS COLOR  MEWS Score Color Green  Oxygen Therapy  SpO2 96 %  O2 Device Room Air  MEWS Score  MEWS Temp 0  MEWS Systolic 0  MEWS Pulse 0  MEWS RR 0  MEWS LOC 0  MEWS Score 0

## 2023-03-22 NOTE — Evaluation (Signed)
 Occupational Therapy Evaluation Patient Details Name: Connie Jackson MRN: [id number] DOB: 06/27/1955 Today's Date: 03/22/2023   History of Present Illness   Pt is a 68 y.o. F who presents after a fall with right acute oblique fracture of the distal fibular metadiaphysis extending  to the level of the distal tibiofibular syndesmosis and suspected tiny acute avulsion injury of the distal tip of the medial  malleolus. Significant PMH: atrial fibrillation.     Clinical Impressions Pt admitted based on above, and was seen based on problem list below. PTA pt was living with her spouse who has dementia. Pt reports independence with ADLs, but avoidance of bathing d/t fatigue and low energy levels. Pt reported completing IADLs with increased time d/t fatigue. Today pt is requiring set up to max assist +2 for ADLs. During session pt was orthostatic and symptomatic. Poor safety awareness and judgement while symptomatic. Recommendation of <3 hours of skilled rehab daily to optimize pt's functional levels.  OT will continue to follow acutely to maximize functional independence.      If plan is discharge home, recommend the following:   Two people to help with walking and/or transfers;A lot of help with bathing/dressing/bathroom;Two people to help with bathing/dressing/bathroom;Assistance with cooking/housework;Assist for transportation;Help with stairs or ramp for entrance     Functional Status Assessment   Patient has had a recent decline in their functional status and demonstrates the ability to make significant improvements in function in a reasonable and predictable amount of time.     Equipment Recommendations   BSC/3in1     Recommendations for Other Services         Precautions/Restrictions   Precautions Precautions: Fall Recall of Precautions/Restrictions: Intact Required Braces or Orthoses: Other Brace Other Brace: L air cast; R CAM boot Restrictions Weight Bearing  Restrictions Per Provider Order: Yes RLE Weight Bearing Per Provider Order: Weight bearing as tolerated LLE Weight Bearing Per Provider Order: Weight bearing as tolerated     Mobility Bed Mobility Overal bed mobility: Needs Assistance Bed Mobility: Supine to Sit, Sit to Supine     Supine to sit: Contact guard, HOB elevated, Used rails Sit to supine: Contact guard assist, HOB elevated, Used rails   General bed mobility comments: Pt able to navigate legs to dangle EOB with CGA and increased time    Transfers                   General transfer comment: Not safe to attempt d/t pt being orthostatic and symptomatic      Balance Overall balance assessment: Needs assistance Sitting-balance support: Feet supported Sitting balance-Leahy Scale: Fair                                     ADL either performed or assessed with clinical judgement   ADL Overall ADL's : Needs assistance/impaired Eating/Feeding: Set up;Sitting   Grooming: Set up;Sitting           Upper Body Dressing : Set up;Sitting   Lower Body Dressing: Maximal assistance;+2 for safety/equipment;Sitting/lateral leans                       Vision Baseline Vision/History: 1 Wears glasses Patient Visual Report: Blurring of vision;Nausea/blurring vision with head movement Vision Assessment?: Wears glasses for reading;Wears glasses for driving     Perception         Praxis  Pertinent Vitals/Pain Pain Assessment Pain Assessment: Faces Faces Pain Scale: Hurts little more Pain Location: R ankle Pain Descriptors / Indicators: Grimacing, Guarding Pain Intervention(s): Monitored during session, Premedicated before session, Repositioned, Limited activity within patient's tolerance     Extremity/Trunk Assessment Upper Extremity Assessment Upper Extremity Assessment: Overall WFL for tasks assessed   Lower Extremity Assessment Lower Extremity Assessment: Defer to PT  evaluation   Cervical / Trunk Assessment Cervical / Trunk Assessment: Normal   Communication Communication Communication: No apparent difficulties   Cognition Arousal: Alert Behavior During Therapy: WFL for tasks assessed/performed Cognition: Cognition impaired   Orientation impairments: Person, Place, Time, Situation Awareness: Intellectual awareness intact, Online awareness impaired   Attention impairment (select first level of impairment): Selective attention Executive functioning impairment (select all impairments): Reasoning, Problem solving OT - Cognition Comments: Pt aware of functional deficits, experiences lightheadedness and dizziness and still attempts movement. Shows poor judgment and safety awareness                 Following commands: Intact       Cueing  General Comments   Cueing Techniques: Verbal cues;Visual cues      Exercises     Shoulder Instructions      Home Living Family/patient expects to be discharged to:: Private residence Living Arrangements: Spouse/significant other Available Help at Discharge: Family;Available PRN/intermittently Type of Home: House Home Access: Ramped entrance     Home Layout: One level     Bathroom Shower/Tub: Chief Strategy Officer: Handicapped height Bathroom Accessibility: No   Home Equipment: Shower seat;Grab bars - tub/shower;Rollator (4 wheels);Wheelchair - manual;Hand held shower head   Additional Comments: Husband is not avaliable to help at d/c would be other family      Prior Functioning/Environment Prior Level of Function : Independent/Modified Independent               ADLs Comments: Pt reports not showering/bathing since Christmas d/t fibromyalgia and fatigue    OT Problem List: Decreased strength;Decreased range of motion;Decreased activity tolerance;Impaired balance (sitting and/or standing);Decreased safety awareness;Decreased knowledge of use of DME or AE   OT  Treatment/Interventions: Self-care/ADL training;Therapeutic exercise;Energy conservation;DME and/or AE instruction;Therapeutic activities;Patient/family education;Balance training      OT Goals(Current goals can be found in the care plan section)   Acute Rehab OT Goals Patient Stated Goal: To go home OT Goal Formulation: With patient Time For Goal Achievement: 04/05/23 Potential to Achieve Goals: Good   OT Frequency:  Min 2X/week    Co-evaluation              AM-PAC OT "6 Clicks" Daily Activity     Outcome Measure Help from another person eating meals?: A Little Help from another person taking care of personal grooming?: A Little Help from another person toileting, which includes using toliet, bedpan, or urinal?: A Lot Help from another person bathing (including washing, rinsing, drying)?: A Lot Help from another person to put on and taking off regular upper body clothing?: A Little Help from another person to put on and taking off regular lower body clothing?: A Lot 6 Click Score: 15   End of Session Equipment Utilized During Treatment: Other (comment) (L CAM Walker, R ankle splint) Nurse Communication: Mobility status;Other (comment) (Orthostatic vital signs)  Activity Tolerance: Patient limited by fatigue;Other (comment) (Orthostatic and symptomatic) Patient left: in bed;with call bell/phone within reach;with bed alarm set  OT Visit Diagnosis: Unsteadiness on feet (R26.81);Other abnormalities of gait and mobility (R26.89);Muscle weakness (generalized) (  M62.81);Dizziness and giddiness (R42)                Time: 1610-9604 OT Time Calculation (min): 27 min Charges:  OT General Charges $OT Visit: 1 Visit OT Evaluation $OT Eval Moderate Complexity: 1 Mod OT Treatments $Self Care/Home Management : 8-22 mins  Ivor Messier, OT  Acute Rehabilitation Services Office [phone] Secure chat preferred   Marilynne Drivers 03/22/2023, 1:20 PM

## 2023-03-22 NOTE — TOC Initial Note (Addendum)
 Transition of Care [facility]) - Initial/Assessment Note    Patient Details  Name: Connie Jackson MRN: [id number] Date of Birth: [birth year]  [facility]) CM/SW Contact:    Lorri Frederick, LCSW Phone Number: 03/22/2023, 1:17 PM  Clinical Narrative:    CSW met with pt regarding PT recommendation for SNF.  Pt agreeable to SNF, requesting [facility].      Pt from home with husband but requesting CSW communicate with son.  Permission given.  Referral sent to [facility], [facility] does accept.           Drucie Opitz approved: [id number] 3/6 - 3/12.    Expected Discharge Plan: Skilled Nursing Facility Barriers to Discharge: Continued Medical Work up, SNF Pending bed offer   Patient Goals and CMS Choice Patient states their goals for this hospitalization and ongoing recovery are:: get back home   Choice offered to / list presented to : Patient (pt requesting [facility] )      Expected Discharge Plan and Services In-house Referral: Clinical Social Work   Post Acute Care Choice: Skilled Nursing Facility Living arrangements for the past 2 months: Single Family Home                                      Prior Living Arrangements/Services Living arrangements for the past 2 months: Single Family Home Lives with:: Spouse Patient language and need for interpreter reviewed:: Yes Do you feel safe going back to the place where you live?: Yes      Need for Family Participation in Patient Care: No (Comment) Care giver support system in place?: Yes (comment) Current home services: Other (comment) (none) Criminal Activity/Legal Involvement Pertinent to Current Situation/Hospitalization: No - Comment as needed  Activities of Daily Living   ADL Screening (condition at time of admission) Independently performs ADLs?: No Does the patient have a NEW difficulty with bathing/dressing/toileting/self-feeding that is expected to last >3 days?: Yes  (Initiates electronic notice to provider for possible OT consult) Does the patient have a NEW difficulty with getting in/out of bed, walking, or climbing stairs that is expected to last >3 days?: No Does the patient have a NEW difficulty with communication that is expected to last >3 days?: No Is the patient deaf or have difficulty hearing?: No Does the patient have difficulty seeing, even when wearing glasses/contacts?: Yes Does the patient have difficulty concentrating, remembering, or making decisions?: Yes  Permission Sought/Granted Permission sought to share information with : Family Supports Permission granted to share information with : Yes, Verbal Permission Granted  Share Information with NAME: husband, son, daughter  Permission granted to share info w AGENCY: SNF        Emotional Assessment Appearance:: Appears older than stated age Attitude/Demeanor/Rapport: Engaged Affect (typically observed): Appropriate, Pleasant Orientation: : Oriented to Self, Oriented to Place, Oriented to  Time, Oriented to Situation      Admission diagnosis:  Fibula fracture [S82.409A] Closed nondisplaced fracture of lateral malleolus of right fibula, initial encounter [S82.64XA] Closed nondisplaced fracture of medial malleolus of left tibia, initial encounter [S82.55XA] Acute pain of both knees [M25.561, M25.562] Patient Active Problem List   Diagnosis Date Noted   Fibula fracture 03/21/2023   Mold exposure 10/04/2017   Dyspnea on exertion 10/04/2017   Abnormal WBC count 10/04/2017   Snoring 10/04/2017   Asthma due to environmental allergies 10/04/2017   Physical deconditioning 10/04/2017  Chest pain 11/17/2013   Edema 11/17/2013   Migraine headache 08/19/2011   Syncope 08/18/2011   Atrial fibrillation (HCC) 12/05/2010   Palpitations 10/26/2010   Hyperlipidemia 10/26/2010   PCP:  Paulina Fusi, MD Pharmacy:   [facility] - [place], [place] - [address] [place] [address] Phone: [phone] Fax: [phone]     Social Drivers of Health (SDOH) Social History: SDOH Screenings   Food Insecurity: No Food Insecurity (03/21/2023)  Housing: Low Risk  (03/21/2023)  Transportation Needs: No Transportation Needs (03/21/2023)  Utilities: Not At Risk (03/21/2023)  Social Connections: Moderately Isolated (03/21/2023)  Tobacco Use: Low Risk  (03/21/2023)   SDOH Interventions:     Readmission Risk Interventions     No data to display

## 2023-03-22 NOTE — Plan of Care (Signed)

## 2023-03-22 NOTE — Progress Notes (Signed)
 20 minute BP recheck. DO notified and aware. Orthostatic BPs ordered.   03/22/23 0813  Vitals  BP (!) 76/42  MAP (mmHg) (!) 54  BP Location Left Arm  BP Method Automatic  Patient Position (if appropriate) Lying

## 2023-03-22 NOTE — NC FL2 (Signed)
 [place] MEDICAID [facility] LEVEL OF CARE FORM     IDENTIFICATION  Patient Name: Connie Jackson Birthdate: [birth year] Sex: female Admission Date (Current Location): 03/21/2023  [facility] and [place] Number:  Producer, television/film/video and Address:  [facility], [address] [place], [place] [address]      Provider Number: [id number]  Attending Physician Name and Address:  Miguel Aschoff, MD  Relative Name and Phone Number:  Petronella, Shuford [phone]    Current Level of Care: Hospital Recommended Level of Care: Skilled Nursing Facility Prior Approval Number:    Date Approved/Denied:   PASRR Number:    Discharge Plan: SNF    Current Diagnoses: Patient Active Problem List   Diagnosis Date Noted   Fibula fracture 03/21/2023   Mold exposure 10/04/2017   Dyspnea on exertion 10/04/2017   Abnormal WBC count 10/04/2017   Snoring 10/04/2017   Asthma due to environmental allergies 10/04/2017   Physical deconditioning 10/04/2017   Chest pain 11/17/2013   Edema 11/17/2013   Migraine headache 08/19/2011   Syncope 08/18/2011   Atrial fibrillation (HCC) 12/05/2010   Palpitations 10/26/2010   Hyperlipidemia 10/26/2010    Orientation RESPIRATION BLADDER Height & Weight     Self, Time, Situation, Place  Normal Incontinent Weight: 230 lb (104.3 kg) Height:  5\' 7"  (170.2 cm)  BEHAVIORAL SYMPTOMS/MOOD NEUROLOGICAL BOWEL NUTRITION STATUS      Continent Diet (see discharge summary)  AMBULATORY STATUS COMMUNICATION OF NEEDS Skin   Limited Assist Verbally Skin abrasions, Other (Comment) (redness)                       Personal Care Assistance Level of Assistance  Bathing, Feeding, Dressing Bathing Assistance: Limited assistance Feeding assistance: Independent Dressing Assistance: Limited assistance     Functional Limitations Info  Sight, Hearing, Speech Sight Info: Adequate Hearing Info: Adequate Speech Info: Adequate    SPECIAL CARE  FACTORS FREQUENCY  PT (By licensed PT), OT (By licensed OT)     PT Frequency: 5x week OT Frequency: 5x week            Contractures Contractures Info: Not present    Additional Factors Info  Code Status, Allergies Code Status Info: DNR Allergies Info: Dht (Dihydrotachysterol), Penicillins, Promethazine Hcl, Sulfa Antibiotics           Current Medications (03/22/2023):  This is the current hospital active medication list Current Facility-Administered Medications  Medication Dose Route Frequency Provider Last Rate Last Admin   acetaminophen (TYLENOL) tablet 1,000 mg  1,000 mg Oral Q8H Gomez-Caraballo, Maria, MD       ALPRAZolam Prudy Feeler) tablet 0.5-1 mg  0.5-1 mg Oral QHS Terrilee Files, MD   0.5 mg at 03/21/23 2120   apixaban (ELIQUIS) tablet 5 mg  5 mg Oral BID Terrilee Files, MD   5 mg at 03/22/23 0940   citalopram (CELEXA) tablet 40 mg  40 mg Oral Daily Terrilee Files, MD   40 mg at 03/22/23 0940   [START ON 03/23/2023] diltiazem (CARDIZEM CD) 24 hr capsule 120 mg  120 mg Oral Daily Katheran James, DO       fesoterodine (TOVIAZ) tablet 4 mg  4 mg Oral Daily Terrilee Files, MD   4 mg at 03/22/23 3086   HYDROmorphone HCl (DILAUDID) liquid 0.5 mg  0.5 mg Oral Q4H PRN Morene Crocker, MD   0.5 mg at 03/21/23 2351   lactated ringers bolus 1,000 mL  1,000 mL Intravenous Once Katheran James, DO       levothyroxine (SYNTHROID) tablet 50 mcg  50 mcg Oral q morning Terrilee Files, MD   50 mcg at 03/22/23 0529   polyethylene glycol (MIRALAX / GLYCOLAX) packet 17 g  17 g Oral Daily PRN Katheran James, DO       rosuvastatin (CRESTOR) tablet 10 mg  10 mg Oral QHS Terrilee Files, MD   10 mg at 03/21/23 2119   senna-docusate (Senokot-S) tablet 1 tablet  1 tablet Oral QHS PRN Katheran James, DO       tiZANidine (ZANAFLEX) tablet 12 mg  12 mg Oral QHS Terrilee Files, MD   12 mg at 03/21/23 2119     Discharge Medications: Please see discharge summary  for a list of discharge medications.  Relevant Imaging Results:  Relevant Lab Results:   Additional Information SSN: [id number]  Lorri Frederick, LCSW

## 2023-03-22 NOTE — Progress Notes (Addendum)
 RE:   Connie Jackson      Date of Birth:  11-26-2055     Date:   03/22/23       To Whom It May Concern:  Please be advised that the above-named patient will require a short-term nursing home stay - anticipated 30 days or less for rehabilitation and strengthening.  The plan is for return home.                 MD signature                Date

## 2023-03-23 DIAGNOSIS — R2681 Unsteadiness on feet: Secondary | ICD-10-CM | POA: Diagnosis not present

## 2023-03-23 DIAGNOSIS — Z743 Need for continuous supervision: Secondary | ICD-10-CM | POA: Diagnosis not present

## 2023-03-23 DIAGNOSIS — S8252XD Displaced fracture of medial malleolus of left tibia, subsequent encounter for closed fracture with routine healing: Secondary | ICD-10-CM | POA: Diagnosis not present

## 2023-03-23 DIAGNOSIS — E785 Hyperlipidemia, unspecified: Secondary | ICD-10-CM | POA: Diagnosis not present

## 2023-03-23 DIAGNOSIS — I951 Orthostatic hypotension: Secondary | ICD-10-CM | POA: Diagnosis present

## 2023-03-23 DIAGNOSIS — K5900 Constipation, unspecified: Secondary | ICD-10-CM | POA: Diagnosis not present

## 2023-03-23 DIAGNOSIS — R262 Difficulty in walking, not elsewhere classified: Secondary | ICD-10-CM | POA: Diagnosis not present

## 2023-03-23 DIAGNOSIS — R001 Bradycardia, unspecified: Secondary | ICD-10-CM | POA: Diagnosis present

## 2023-03-23 DIAGNOSIS — M797 Fibromyalgia: Secondary | ICD-10-CM | POA: Diagnosis not present

## 2023-03-23 DIAGNOSIS — F3189 Other bipolar disorder: Secondary | ICD-10-CM | POA: Diagnosis not present

## 2023-03-23 DIAGNOSIS — M25571 Pain in right ankle and joints of right foot: Secondary | ICD-10-CM | POA: Diagnosis not present

## 2023-03-23 DIAGNOSIS — R2689 Other abnormalities of gait and mobility: Secondary | ICD-10-CM | POA: Diagnosis not present

## 2023-03-23 DIAGNOSIS — R531 Weakness: Secondary | ICD-10-CM | POA: Diagnosis not present

## 2023-03-23 DIAGNOSIS — M810 Age-related osteoporosis without current pathological fracture: Secondary | ICD-10-CM | POA: Diagnosis not present

## 2023-03-23 DIAGNOSIS — F413 Other mixed anxiety disorders: Secondary | ICD-10-CM | POA: Diagnosis not present

## 2023-03-23 DIAGNOSIS — F319 Bipolar disorder, unspecified: Secondary | ICD-10-CM | POA: Diagnosis not present

## 2023-03-23 DIAGNOSIS — S89301D Unspecified physeal fracture of lower end of right fibula, subsequent encounter for fracture with routine healing: Secondary | ICD-10-CM | POA: Diagnosis not present

## 2023-03-23 DIAGNOSIS — S8252XA Displaced fracture of medial malleolus of left tibia, initial encounter for closed fracture: Secondary | ICD-10-CM | POA: Diagnosis present

## 2023-03-23 DIAGNOSIS — F32A Depression, unspecified: Secondary | ICD-10-CM | POA: Diagnosis not present

## 2023-03-23 DIAGNOSIS — N179 Acute kidney failure, unspecified: Secondary | ICD-10-CM | POA: Diagnosis present

## 2023-03-23 DIAGNOSIS — I4891 Unspecified atrial fibrillation: Secondary | ICD-10-CM | POA: Diagnosis not present

## 2023-03-23 DIAGNOSIS — I10 Essential (primary) hypertension: Secondary | ICD-10-CM | POA: Diagnosis not present

## 2023-03-23 DIAGNOSIS — M25572 Pain in left ankle and joints of left foot: Secondary | ICD-10-CM | POA: Diagnosis not present

## 2023-03-23 DIAGNOSIS — E039 Hypothyroidism, unspecified: Secondary | ICD-10-CM | POA: Diagnosis not present

## 2023-03-23 DIAGNOSIS — S8261XD Displaced fracture of lateral malleolus of right fibula, subsequent encounter for closed fracture with routine healing: Secondary | ICD-10-CM | POA: Diagnosis not present

## 2023-03-23 LAB — RENAL FUNCTION PANEL
Albumin: 3.1 g/dL — ABNORMAL LOW (ref 3.5–5.0)
Anion gap: 10 (ref 5–15)
BUN: 14 mg/dL (ref 8–23)
CO2: 26 mmol/L (ref 22–32)
Calcium: 9.1 mg/dL (ref 8.9–10.3)
Chloride: 102 mmol/L (ref 98–111)
Creatinine, Ser: 1.43 mg/dL — ABNORMAL HIGH (ref 0.44–1.00)
GFR, Estimated: 40 mL/min — ABNORMAL LOW (ref >60)
Glucose, Bld: 99 mg/dL (ref 70–99)
Phosphorus: 3.4 mg/dL (ref 2.5–4.6)
Potassium: 3.5 mmol/L (ref 3.5–5.1)
Sodium: 138 mmol/L (ref 135–145)

## 2023-03-23 LAB — CBC
HCT: 37.4 % (ref 36.0–46.0)
Hemoglobin: 12.3 g/dL (ref 12.0–15.0)
MCH: 31.3 pg (ref 26.0–34.0)
MCHC: 32.9 g/dL (ref 30.0–36.0)
MCV: 95.2 fL (ref 80.0–100.0)
Platelets: 322 10*3/uL (ref 150–400)
RBC: 3.93 MIL/uL (ref 3.87–5.11)
RDW: 13.5 % (ref 11.5–15.5)
WBC: 11.1 10*3/uL — ABNORMAL HIGH (ref 4.0–10.5)
nRBC: 0 % (ref 0.0–0.2)

## 2023-03-23 MED ORDER — HYDROMORPHONE HCL 2 MG PO TABS
1.0000 mg | ORAL_TABLET | Freq: Once | ORAL | Status: AC
  Administered 2023-03-23: 1 mg via ORAL
  Filled 2023-03-23: qty 1

## 2023-03-23 MED ORDER — SENNOSIDES-DOCUSATE SODIUM 8.6-50 MG PO TABS: 1.0000 | ORAL_TABLET | Freq: Every evening | ORAL | Status: AC | PRN

## 2023-03-23 MED ORDER — POLYETHYLENE GLYCOL 3350 17 G PO PACK: 17.0000 g | PACK | Freq: Every day | ORAL | Status: AC | PRN

## 2023-03-23 MED ORDER — DILTIAZEM HCL ER COATED BEADS 120 MG PO CP24: 120.0000 mg | ORAL_CAPSULE | Freq: Every day | ORAL | Status: AC

## 2023-03-23 MED ORDER — APIXABAN 5 MG PO TABS: 5.0000 mg | ORAL_TABLET | Freq: Two times a day (BID) | ORAL | Status: AC

## 2023-03-23 NOTE — Discharge Instructions (Addendum)
 Ms Connie Jackson, Connie Jackson were hospitalized for bilateral ankle fractures. Though unpleasant, these fractures will heal without surgery. It is good to see you doing well with physical therapy. Continue to work on Therapist, music. The pain will linger but in time you will recover. We will discharge you to [facility] for your further recovery.  While here, your blood pressures were low. I suspect that your medications played a role. As you know, the diltiazem and metoprolol calm your heart, but if too high can prevent your heart from responding to stress and low blood pressure. We have reduced the dose of your diltiazem and will keep your metoprolol, although have asked you and your [facility] caregivers to hold this medicine unless your heart rate becomes high. At your next doctor visit, please discuss these medicines with your doctors. If your blood pressure improves, it would be safer to restart these medicines.  Please call your primary doctor to set up a post-hospital visit.

## 2023-03-23 NOTE — TOC Progression Note (Addendum)
 Transition of Care [facility]) - Progression Note    Patient Details  Name: Connie Jackson MRN: [id number] Date of Birth: 1955/10/14  [facility]) CM/SW Contact  Lorri Frederick, LCSW Phone Number: 03/23/2023, 8:48 AM  Clinical Narrative:   PASSR received: [id number] E  [id number]: CSW confirmed with [facility] that they can receive pt today.    MD informed.   Expected Discharge Plan: Skilled Nursing Facility Barriers to Discharge: Continued Medical Work up, SNF Pending bed offer  Expected Discharge Plan and Services In-house Referral: Clinical Social Work   Post Acute Care Choice: Skilled Nursing Facility Living arrangements for the past 2 months: Single Family Home                                       Social Determinants of Health (SDOH) Interventions SDOH Screenings   Food Insecurity: No Food Insecurity (03/21/2023)  Housing: Low Risk  (03/21/2023)  Transportation Needs: No Transportation Needs (03/21/2023)  Utilities: Not At Risk (03/21/2023)  Social Connections: Moderately Isolated (03/21/2023)  Tobacco Use: Low Risk  (03/21/2023)    Readmission Risk Interventions     No data to display

## 2023-03-23 NOTE — Discharge Summary (Addendum)
 Name: Connie Jackson MRN: [id number] DOB: 01-21-1955 68 y.o. PCP: Connie Fusi, MD  Date of Admission: 03/21/2023  8:53 AM Date of Discharge: 03/23/2023 03/23/23 Attending Physician: Dr. Mayford Jackson  DISCHARGE DIAGNOSIS:  Primary Problem: Closed right fibular fracture   Hospital Problems: Principal Problem:   Closed right fibular fracture Active Problems:   Bradycardia   Orthostatic hypotension   Acute kidney injury (HCC)   Avulsion fracture of medial malleolus of left tibia  DISCHARGE MEDICATIONS:   Allergies as of 03/23/2023       Reactions   Dht [dihydrotachysterol] Dermatitis   Scar at injection site   Penicillins Other (See Comments)   Almost died   Promethazine Hcl    Sulfa Antibiotics         Medication List     PAUSE taking these medications    metoprolol succinate 100 MG 24 hr tablet Wait to take this until your doctor or other care provider tells you to start again. Commonly known as: TOPROL-XL Take 100 mg by mouth at bedtime.       STOP taking these medications    diltiazem 240 MG 24 hr capsule Commonly known as: TIAZAC Replaced by: diltiazem 120 MG 24 hr capsule       TAKE these medications    ALPRAZolam 0.5 MG tablet Commonly known as: XANAX Take 0.5-1 mg by mouth at bedtime.   apixaban 5 MG Tabs tablet Commonly known as: Eliquis Take 1 tablet (5 mg total) by mouth 2 (two) times daily. What changed: when to take this   citalopram 40 MG tablet Commonly known as: CELEXA Take 40 mg by mouth daily.   diltiazem 120 MG 24 hr capsule Commonly known as: CARDIZEM CD Take 1 capsule (120 mg total) by mouth daily. Start taking on: March 24, 2023 Replaces: diltiazem 240 MG 24 hr capsule   eletriptan 40 MG tablet Commonly known as: RELPAX Take 40 mg by mouth as needed for migraine or headache.   Mometasone Furoate 100 MCG/ACT Aero Commonly known as: Asmanex HFA Inhale 2 puffs into the lungs 2 (two) times daily.   polyethylene  glycol 17 g packet Commonly known as: MIRALAX / GLYCOLAX Take 17 g by mouth daily as needed for mild constipation.   rosuvastatin 10 MG tablet Commonly known as: CRESTOR Take 10 mg by mouth at bedtime.   senna-docusate 8.6-50 MG tablet Commonly known as: Senokot-S Take 1 tablet by mouth at bedtime as needed for moderate constipation.   solifenacin 5 MG tablet Commonly known as: VESICARE SMARTSIG:1 Tablet(s) By Mouth Every Evening   Synthroid 50 MCG tablet Generic drug: levothyroxine Take 50 mcg by mouth every morning.   tiZANidine 4 MG tablet Commonly known as: ZANAFLEX Take 3 tablets by mouth at bedtime. 1 during the day if needed        DISPOSITION AND FOLLOW-UP:  ConnieConnie Jackson was discharged from [facility] in Stable condition. At the hospital follow up visit please address:  Follow-up Recommendations: Labs: Basic Metabolic Profile per AKI Medications: Changes made in hospital: Diltiazem reduce from 240qd to 120 every day. Stop metoprolol 100 but resume if HR above 100 and BP stable and concern for rapid Afib.   Follow-up Appointments:  Contact information for after-discharge care     Destination     [facility] Preferred SNF .   Service: Skilled Paramedic information: [address] [place] [address] [phone]  She will make a follow up with her PCP.  At follow up, consider evaluating her medications regimen. It is possible that her psychiatric, sedating, and heart medicines are contributing to her energy level and balance. It is unclear if these contributed to her fall. She demonstrated orthostatic symptoms while here. She was also hypotensive while here, although tolerated them well at rest. Per above, we adjusted her CCB and BB. We held sedating prn medicines such as pain control as able. Further adjustment of home medications may be indicated after  hospital. Consider reducing the doses of her tizanidine and xanax, as able and indicated.  HOSPITAL COURSE:  Patient Summary: Connie Jackson is a 68 year old female with a past medical history of fibromyalgia, bipolar disease, atrial fibrillation on Eliquis, hypertension, hyperlipidemia who presented to [facility] ED via EMS on 03/21/23 after experiencing a fall from standing 3/4 at her home and was admitted for bilateral ankle fractures of the R fibula and L medial malleolus.   Ankle fractures of R distal fibula and L medial malleolus Fall from Standing This patient experienced a fall from standing at home.  There was not syncope, head trauma, chest pain.  Appears she lost balance going from bed to bathroom around midnight. Unfortunately she was therafter bedbound and unable to care for herself  In hospital, she had splints placed and worked with physical therapy.  Her injury does not require surgery. She does need SNF for recovery.  Pain was controlled.  Examination did not suggest any other occult injuries.  Knee XR and CXR unrevealing.  - Dilaudid pain control - MiraLAX and Senokot for bowel regimen - She will follow up with [employer]. Instructions provided for contact to set up appointment.   Hypotension, Orthostatic Hypotension Sinus bradycardia MAP 55 on first morning of stay but she was oriented, warm, asymptomatic. When working with OT, she had mild orthostatic symptoms and orthostatic hypotension. HR did not appear to increase as would be expected. Bps not low at admission, but possible sympathetic response that wore off led to BP issues. Query medications effect per above, previously necessary regiment may now have changed and impacted her. She requires many medicines that could contribute to this. Certainly her pain treatment with dilaudid might contributed, and so that was minimized here. Further in hospital, her metoprolol was held and diltiazem reduced to half. She  received 3L fluids with very mild elevated lactic acid to 2 that resolved. Will discharge with changes per below.  - hold Metoprolol but resume should HR go aboave 100 and Bps are stable with MAPs well above 65. Reduce and continue Cardizem to 120 qd   Concern for polypharmacy Chronic Fatigue Pt reports severe fatigue daily that impacts day-to-day, for example unable/willing to leave her home since Christmas. She was also hypotensive and bradycardic per above. Her complex medical status of psychiatric disease, fibromyalgia, possible chronic fatigue certainly play a role. However her regimen of tizanidine, xanax, high dose CCB/BB could also contribute.  - Checked TSH, it was normal - med changes per above - would revisit use of xanax, tizanidine as outpatient  AKI AGMA AKI at admission stabilized with fluids. Likely prerenal given pre-hospital bed-bound state with reduced intake and hypotension. AG closed with fluids. A BMP can be collected at follow up to confirm improvement. Bladder scan confirmed no retention.   Atrial Fibrillation Hypertension Sinus Bradycardia Stable while here. - Med changes per above. - Eliquis 5 BID increased from qd   Chronic conditions  Bipolar disease Depression and anxiety Continue celexa 40 every day Continue xanax 1mg  at bedtime    Fibromyalgia Resume home tizanidine 12 mg nightly   Hyperlipidemia Resume home Crestor 10 daily   Hypothyroidism Synthroid 50 daily   Bladder Incontinence Continue home fesoterodine 4 qd   DISCHARGE INSTRUCTIONS:   Discharge Instructions     Diet - low sodium heart healthy   Complete by: As directed    Discharge instructions   Complete by: As directed    Ms Massa, Pe were hospitalized for bilateral ankle fractures. Though unpleasant, these fractures will heal without surgery. It is good to see you doing well with physical therapy. Continue to work on Therapist, music. The pain will linger but in time you  will recover. We will discharge you to [facility] for your further recovery. PLAN TO FOLLOW UP WITH [facility]. Please call them at [phone] upon discharge to set up your appointment. Their address is [address], [place], [place] [address]   While here, your blood pressures were low. You tolerated them well and your pressures improved while here, but there is risk if your blood pressure drops further. I suspect that your medications played a role. As you know, the diltiazem and metoprolol calm your heart, but if too high can prevent your heart from responding to stress and low blood pressure. We have reduced the dose of your diltiazem and will keep your metoprolol, although have asked you and your [facility] caregivers to hold this medicine unless your heart rate becomes high. At your next doctor visit, please discuss these medicines with your doctors. If your blood pressure improves, it would be safe to restart these medicines.   Please call your primary doctor to set up a post-hospital visit.   Increase activity slowly   Complete by: As directed        SUBJECTIVE:  Feeling well this morning. Pain is relatively well controlled. Blood pressures getting better, she continues to deny symptoms at rest and no symptoms with movement since yesterday. She is glad to hear she has a bed at [facility].  Discharge Vitals:   BP 127/64   Pulse 69   Temp 98 F (36.7 C) (Oral)   Resp 16   Ht 5\' 7"  (1.702 m)   Wt 104.3 kg   SpO2 96%   BMI 36.02 kg/m   OBJECTIVE:  Physical Exam Constitutional:      General: She is not in acute distress.    Appearance: She is obese. She is not ill-appearing.  Cardiovascular:     Rate and Rhythm: Normal rate and regular rhythm.  Pulmonary:     Effort: Pulmonary effort is normal.     Breath sounds: Normal breath sounds.  Musculoskeletal:        General: Signs of injury present. No swelling.     Right lower leg: No edema.     Left lower leg: No edema.      Comments: Both ankles are secured in splints. There is not severe swelling or discoloration. The distal extremities are perfused. There is not tenderness to light palpation.  Skin:    General: Skin is warm and dry.  Neurological:     Mental Status: She is alert and oriented to person, place, and time.     Pertinent Labs, Studies, and Procedures:     Latest Ref Rng & Units 03/23/2023   10:16 AM 03/22/2023    6:19 AM 03/21/2023   10:30 AM  CBC  WBC 4.0 - 10.5 K/uL 11.1  12.2  13.3   Hemoglobin 12.0 - 15.0 g/dL 16.1  09.6  04.5   Hematocrit 36.0 - 46.0 % 37.4  31.2  40.0   Platelets 150 - 400 K/uL 322  286  318        Latest Ref Rng & Units 03/23/2023   10:16 AM 03/22/2023    6:19 AM 03/21/2023   10:30 AM  CMP  Glucose 70 - 99 mg/dL 99  409  811   BUN 8 - 23 mg/dL 14  14  12    Creatinine 0.44 - 1.00 mg/dL 9.14  7.82  9.56   Sodium 135 - 145 mmol/L 138  135  137   Potassium 3.5 - 5.1 mmol/L 3.5  4.1  3.9   Chloride 98 - 111 mmol/L 102  104  100   CO2 22 - 32 mmol/L 26  23  21    Calcium 8.9 - 10.3 mg/dL 9.1  8.5  9.2     DG Chest 1 View Result Date: 03/21/2023 CLINICAL DATA:  Fall unable to bear weight. EXAM: CHEST  1 VIEW COMPARISON:  Chest radiograph 08/11/2022 and 02/10/2021 FINDINGS: Cardiac silhouette is again moderately enlarged. Mediastinal contours are within limits. The lungs are clear. No pleural effusion pneumothorax. Mild dextrocurvature of the mid to lower thoracic spine with moderate multilevel degenerative disc changes. IMPRESSION: 1. No active disease. 2. Moderate cardiomegaly. Electronically Signed   By: Neita Garnet M.D.   On: 03/21/2023 11:58   DG Ankle Complete Right Result Date: 03/21/2023 CLINICAL DATA:  Fall.  Pain. EXAM: RIGHT ANKLE - COMPLETE 3+ VIEW COMPARISON:  None Available. FINDINGS: There is diffuse decreased bone mineralization. Mild tibiotalar joint space narrowing. The ankle mortise is symmetric and intact. Mild dorsal talonavicular and mild dorsal  tarsometatarsal degenerative osteophytosis. Mild-to-moderate plantar calcaneal heel spur. Best seen on oblique view, there is an oblique acute fracture of the distal fibular metadiaphysis extending to the level of the distal tibiofibular syndesmosis. Minimal 1 mm cortical step-off of the lateral cortex on oblique view but otherwise no displacement. Mild-to-moderate lateral malleolar soft tissue swelling. IMPRESSION: Acute oblique fracture of the distal fibular metadiaphysis extending to the level of the distal tibiofibular syndesmosis. Electronically Signed   By: Neita Garnet M.D.   On: 03/21/2023 11:56   DG Ankle Complete Left Result Date: 03/21/2023 CLINICAL DATA:  Fall.  Pain. EXAM: LEFT ANKLE COMPLETE - 3+ VIEW COMPARISON:  None Available. FINDINGS: There is diffuse decreased bone mineralization. There is mild transverse linear lucency separating the distal 2 mm tip of the medial malleolus by less than 1 mm, suspicious for a tiny avulsion injury. There is mild overlying medial ankle soft tissue swelling. The ankle mortise is symmetric and intact. Moderate plantar calcaneal heel spur. Mild talonavicular and navicular-cuneiform joint space narrowing. IMPRESSION: Suspected tiny acute avulsion injury of the distal tip of the medial malleolus. Recommend correlation for point tenderness. Electronically Signed   By: Neita Garnet M.D.   On: 03/21/2023 11:54   DG Knee Complete 4 Views Right Result Date: 03/21/2023 CLINICAL DATA:  Fall.  Pain. EXAM: RIGHT KNEE - COMPLETE 4+ VIEW COMPARISON:  None Available. FINDINGS: There is diffuse decreased bone mineralization. Severe patellofemoral joint space narrowing and peripheral osteophytosis. Mild medial and lateral compartment joint space narrowing and peripheral osteophytosis. Mild degenerative irregularity of the weight-bearing medial femoral condyle articular surface. No significant joint effusion. No acute fracture is seen. No dislocation. IMPRESSION: Severe  patellofemoral and  mild medial and lateral compartment osteoarthritis. No acute fracture is seen. Electronically Signed   By: Neita Garnet M.D.   On: 03/21/2023 11:51   DG Knee Complete 4 Views Left Result Date: 03/21/2023 CLINICAL DATA:  Fall.  Pain.  Unable to bear weight. EXAM: LEFT KNEE - COMPLETE 4+ VIEW COMPARISON:  None Available. FINDINGS: There is diffuse decreased bone mineralization. Severe patellofemoral joint space narrowing and peripheral osteophytosis. Tiny joint effusion. Mild medial and lateral compartment joint space narrowing and peripheral osteophytosis. No acute fracture or dislocation. IMPRESSION: 1. Severe patellofemoral and mild medial and lateral compartment osteoarthritis. 2. Tiny joint effusion. 3. No acute fracture is seen. Electronically Signed   By: Neita Garnet M.D.   On: 03/21/2023 11:49     Signed: Katheran James, DO Internal Medicine Resident, PGY-1 [facility]  Pager: [phone] 9:25 PM, 03/23/2023

## 2023-03-23 NOTE — TOC Transition Note (Signed)
 Transition of Care [facility]) - Discharge Note   Patient Details  Name: AMERIS AKAMINE MRN: [id number] Date of Birth: 1955/12/04  Transition [facility]) CM/SW Contact:  Lorri Frederick, LCSW Phone Number: 03/23/2023, 2:10 PM   Clinical Narrative:   Pt discharging to [facility] [place], room 710. RN report to [phone]     Final next level of care: Skilled Nursing Facility Barriers to Discharge: Barriers Resolved   Patient Goals and CMS Choice Patient states their goals for this hospitalization and ongoing recovery are:: get back home   Choice offered to / list presented to : Patient (pt requesting [facility])      Discharge Placement              Patient chooses bed at: [facility], [place] Patient to be transferred to facility by: [facility]tar Name of family member notified: son Feliz Beam Patient and family notified of of transfer: 03/23/23  Discharge Plan and Services Additional resources added to the After Visit Summary for   In-house Referral: Clinical Social Work   Post Acute Care Choice: Skilled Nursing Facility                               Social Drivers of Health (SDOH) Interventions SDOH Screenings   Food Insecurity: No Food Insecurity (03/21/2023)  Housing: Low Risk  (03/21/2023)  Transportation Needs: No Transportation Needs (03/21/2023)  Utilities: Not At Risk (03/21/2023)  Social Connections: Moderately Isolated (03/21/2023)  Tobacco Use: Low Risk  (03/21/2023)     Readmission Risk Interventions     No data to display

## 2023-03-23 NOTE — Progress Notes (Signed)
 AVS Reviewed.  IV removed. Report called to Jacki Cones RN at [facility] [facility].  All questions answered.  Patient excited for Discharge.  Awaiting PTAR

## 2023-03-23 NOTE — Plan of Care (Signed)

## 2023-03-23 NOTE — Plan of Care (Signed)

## 2023-03-26 DIAGNOSIS — M810 Age-related osteoporosis without current pathological fracture: Secondary | ICD-10-CM | POA: Diagnosis not present

## 2023-03-26 DIAGNOSIS — S89301D Unspecified physeal fracture of lower end of right fibula, subsequent encounter for fracture with routine healing: Secondary | ICD-10-CM | POA: Diagnosis not present

## 2023-03-26 DIAGNOSIS — I4891 Unspecified atrial fibrillation: Secondary | ICD-10-CM | POA: Diagnosis not present

## 2023-03-26 DIAGNOSIS — R262 Difficulty in walking, not elsewhere classified: Secondary | ICD-10-CM | POA: Diagnosis not present

## 2023-03-29 DIAGNOSIS — F413 Other mixed anxiety disorders: Secondary | ICD-10-CM | POA: Diagnosis not present

## 2023-03-29 DIAGNOSIS — F32A Depression, unspecified: Secondary | ICD-10-CM | POA: Diagnosis not present

## 2023-03-29 DIAGNOSIS — F3189 Other bipolar disorder: Secondary | ICD-10-CM | POA: Diagnosis not present

## 2023-04-02 DIAGNOSIS — M25572 Pain in left ankle and joints of left foot: Secondary | ICD-10-CM | POA: Diagnosis not present

## 2023-04-02 DIAGNOSIS — M25571 Pain in right ankle and joints of right foot: Secondary | ICD-10-CM | POA: Diagnosis not present

## 2023-04-13 DIAGNOSIS — S8264XA Nondisplaced fracture of lateral malleolus of right fibula, initial encounter for closed fracture: Secondary | ICD-10-CM | POA: Diagnosis not present

## 2023-04-13 DIAGNOSIS — N179 Acute kidney failure, unspecified: Secondary | ICD-10-CM | POA: Diagnosis not present

## 2023-04-13 DIAGNOSIS — R001 Bradycardia, unspecified: Secondary | ICD-10-CM | POA: Diagnosis not present

## 2023-04-13 DIAGNOSIS — R7301 Impaired fasting glucose: Secondary | ICD-10-CM | POA: Diagnosis not present

## 2023-04-13 DIAGNOSIS — I480 Paroxysmal atrial fibrillation: Secondary | ICD-10-CM | POA: Diagnosis not present

## 2023-04-13 DIAGNOSIS — D519 Vitamin B12 deficiency anemia, unspecified: Secondary | ICD-10-CM | POA: Diagnosis not present

## 2023-04-13 DIAGNOSIS — M797 Fibromyalgia: Secondary | ICD-10-CM | POA: Diagnosis not present

## 2023-04-13 DIAGNOSIS — F332 Major depressive disorder, recurrent severe without psychotic features: Secondary | ICD-10-CM | POA: Diagnosis not present

## 2023-04-13 DIAGNOSIS — S8255XA Nondisplaced fracture of medial malleolus of left tibia, initial encounter for closed fracture: Secondary | ICD-10-CM | POA: Diagnosis not present

## 2023-04-13 DIAGNOSIS — I951 Orthostatic hypotension: Secondary | ICD-10-CM | POA: Diagnosis not present

## 2023-04-13 DIAGNOSIS — E039 Hypothyroidism, unspecified: Secondary | ICD-10-CM | POA: Diagnosis not present

## 2023-04-13 DIAGNOSIS — E785 Hyperlipidemia, unspecified: Secondary | ICD-10-CM | POA: Diagnosis not present

## 2023-04-20 DIAGNOSIS — E785 Hyperlipidemia, unspecified: Secondary | ICD-10-CM | POA: Diagnosis not present

## 2023-04-20 DIAGNOSIS — I480 Paroxysmal atrial fibrillation: Secondary | ICD-10-CM | POA: Diagnosis not present

## 2023-04-20 DIAGNOSIS — D519 Vitamin B12 deficiency anemia, unspecified: Secondary | ICD-10-CM | POA: Diagnosis not present

## 2023-04-20 DIAGNOSIS — M797 Fibromyalgia: Secondary | ICD-10-CM | POA: Diagnosis not present

## 2023-04-20 DIAGNOSIS — R7301 Impaired fasting glucose: Secondary | ICD-10-CM | POA: Diagnosis not present

## 2023-04-20 DIAGNOSIS — J45901 Unspecified asthma with (acute) exacerbation: Secondary | ICD-10-CM | POA: Diagnosis not present

## 2023-04-20 DIAGNOSIS — E039 Hypothyroidism, unspecified: Secondary | ICD-10-CM | POA: Diagnosis not present

## 2023-04-20 DIAGNOSIS — F319 Bipolar disorder, unspecified: Secondary | ICD-10-CM | POA: Diagnosis not present

## 2023-04-20 DIAGNOSIS — F332 Major depressive disorder, recurrent severe without psychotic features: Secondary | ICD-10-CM | POA: Diagnosis not present

## 2023-04-30 DIAGNOSIS — M25572 Pain in left ankle and joints of left foot: Secondary | ICD-10-CM | POA: Diagnosis not present

## 2023-04-30 DIAGNOSIS — M25571 Pain in right ankle and joints of right foot: Secondary | ICD-10-CM | POA: Diagnosis not present

## 2023-05-11 DIAGNOSIS — R109 Unspecified abdominal pain: Secondary | ICD-10-CM | POA: Diagnosis not present

## 2023-05-11 DIAGNOSIS — D72829 Elevated white blood cell count, unspecified: Secondary | ICD-10-CM | POA: Diagnosis not present

## 2023-05-11 DIAGNOSIS — K5730 Diverticulosis of large intestine without perforation or abscess without bleeding: Secondary | ICD-10-CM | POA: Diagnosis not present

## 2023-05-11 DIAGNOSIS — K429 Umbilical hernia without obstruction or gangrene: Secondary | ICD-10-CM | POA: Diagnosis not present

## 2023-05-11 DIAGNOSIS — N200 Calculus of kidney: Secondary | ICD-10-CM | POA: Diagnosis not present

## 2023-05-11 DIAGNOSIS — N3090 Cystitis, unspecified without hematuria: Secondary | ICD-10-CM | POA: Diagnosis not present

## 2023-05-12 DIAGNOSIS — I4510 Unspecified right bundle-branch block: Secondary | ICD-10-CM | POA: Diagnosis not present

## 2023-05-12 DIAGNOSIS — I480 Paroxysmal atrial fibrillation: Secondary | ICD-10-CM | POA: Diagnosis not present

## 2023-05-12 DIAGNOSIS — N390 Urinary tract infection, site not specified: Secondary | ICD-10-CM | POA: Diagnosis not present

## 2023-05-12 DIAGNOSIS — J9811 Atelectasis: Secondary | ICD-10-CM | POA: Diagnosis not present

## 2023-05-12 DIAGNOSIS — Z7901 Long term (current) use of anticoagulants: Secondary | ICD-10-CM | POA: Diagnosis not present

## 2023-05-12 DIAGNOSIS — N12 Tubulo-interstitial nephritis, not specified as acute or chronic: Secondary | ICD-10-CM | POA: Diagnosis not present

## 2023-05-12 DIAGNOSIS — I10 Essential (primary) hypertension: Secondary | ICD-10-CM | POA: Diagnosis not present

## 2023-05-12 DIAGNOSIS — D72829 Elevated white blood cell count, unspecified: Secondary | ICD-10-CM | POA: Diagnosis not present

## 2023-05-12 DIAGNOSIS — N3090 Cystitis, unspecified without hematuria: Secondary | ICD-10-CM | POA: Diagnosis not present

## 2023-05-12 DIAGNOSIS — Z79899 Other long term (current) drug therapy: Secondary | ICD-10-CM | POA: Diagnosis not present

## 2023-05-12 DIAGNOSIS — R079 Chest pain, unspecified: Secondary | ICD-10-CM | POA: Diagnosis not present

## 2023-05-12 DIAGNOSIS — K5730 Diverticulosis of large intestine without perforation or abscess without bleeding: Secondary | ICD-10-CM | POA: Diagnosis not present

## 2023-05-12 DIAGNOSIS — B9620 Unspecified Escherichia coli [E. coli] as the cause of diseases classified elsewhere: Secondary | ICD-10-CM | POA: Diagnosis not present

## 2023-05-12 DIAGNOSIS — E7800 Pure hypercholesterolemia, unspecified: Secondary | ICD-10-CM | POA: Diagnosis not present

## 2023-05-12 DIAGNOSIS — R109 Unspecified abdominal pain: Secondary | ICD-10-CM | POA: Diagnosis not present

## 2023-05-12 DIAGNOSIS — I517 Cardiomegaly: Secondary | ICD-10-CM | POA: Diagnosis not present

## 2023-05-12 DIAGNOSIS — Z950 Presence of cardiac pacemaker: Secondary | ICD-10-CM | POA: Diagnosis not present

## 2023-05-12 DIAGNOSIS — N200 Calculus of kidney: Secondary | ICD-10-CM | POA: Diagnosis not present

## 2023-05-12 DIAGNOSIS — I440 Atrioventricular block, first degree: Secondary | ICD-10-CM | POA: Diagnosis not present

## 2023-05-12 DIAGNOSIS — I868 Varicose veins of other specified sites: Secondary | ICD-10-CM | POA: Diagnosis not present

## 2023-05-12 DIAGNOSIS — Z87442 Personal history of urinary calculi: Secondary | ICD-10-CM | POA: Diagnosis not present

## 2023-05-12 DIAGNOSIS — K429 Umbilical hernia without obstruction or gangrene: Secondary | ICD-10-CM | POA: Diagnosis not present

## 2023-05-12 DIAGNOSIS — N136 Pyonephrosis: Secondary | ICD-10-CM | POA: Diagnosis not present

## 2023-05-12 DIAGNOSIS — E7849 Other hyperlipidemia: Secondary | ICD-10-CM | POA: Diagnosis not present

## 2023-05-12 DIAGNOSIS — I4519 Other right bundle-branch block: Secondary | ICD-10-CM | POA: Diagnosis not present

## 2023-05-12 DIAGNOSIS — E038 Other specified hypothyroidism: Secondary | ICD-10-CM | POA: Diagnosis not present

## 2023-05-12 DIAGNOSIS — Z66 Do not resuscitate: Secondary | ICD-10-CM | POA: Diagnosis not present

## 2023-05-12 DIAGNOSIS — F419 Anxiety disorder, unspecified: Secondary | ICD-10-CM | POA: Diagnosis not present

## 2023-05-12 DIAGNOSIS — I495 Sick sinus syndrome: Secondary | ICD-10-CM | POA: Diagnosis not present

## 2023-05-12 DIAGNOSIS — R001 Bradycardia, unspecified: Secondary | ICD-10-CM | POA: Diagnosis not present

## 2023-05-20 DIAGNOSIS — J45901 Unspecified asthma with (acute) exacerbation: Secondary | ICD-10-CM | POA: Diagnosis not present

## 2023-05-24 DIAGNOSIS — F5105 Insomnia due to other mental disorder: Secondary | ICD-10-CM | POA: Diagnosis not present

## 2023-05-24 DIAGNOSIS — F0390 Unspecified dementia without behavioral disturbance: Secondary | ICD-10-CM | POA: Diagnosis not present

## 2023-05-28 DIAGNOSIS — Z45018 Encounter for adjustment and management of other part of cardiac pacemaker: Secondary | ICD-10-CM | POA: Diagnosis not present

## 2023-05-28 DIAGNOSIS — I480 Paroxysmal atrial fibrillation: Secondary | ICD-10-CM | POA: Diagnosis not present

## 2023-05-28 DIAGNOSIS — I495 Sick sinus syndrome: Secondary | ICD-10-CM | POA: Diagnosis not present

## 2023-06-05 DIAGNOSIS — I480 Paroxysmal atrial fibrillation: Secondary | ICD-10-CM | POA: Diagnosis not present

## 2023-06-05 DIAGNOSIS — N390 Urinary tract infection, site not specified: Secondary | ICD-10-CM | POA: Diagnosis not present

## 2023-06-05 DIAGNOSIS — N200 Calculus of kidney: Secondary | ICD-10-CM | POA: Diagnosis not present

## 2023-06-05 DIAGNOSIS — B9620 Unspecified Escherichia coli [E. coli] as the cause of diseases classified elsewhere: Secondary | ICD-10-CM | POA: Diagnosis not present

## 2023-06-05 DIAGNOSIS — I495 Sick sinus syndrome: Secondary | ICD-10-CM | POA: Diagnosis not present

## 2023-06-20 DIAGNOSIS — J45901 Unspecified asthma with (acute) exacerbation: Secondary | ICD-10-CM | POA: Diagnosis not present

## 2023-06-27 DIAGNOSIS — I480 Paroxysmal atrial fibrillation: Secondary | ICD-10-CM | POA: Diagnosis not present

## 2023-06-27 DIAGNOSIS — Z45010 Encounter for checking and testing of cardiac pacemaker pulse generator [battery]: Secondary | ICD-10-CM | POA: Diagnosis not present

## 2023-07-09 DIAGNOSIS — N200 Calculus of kidney: Secondary | ICD-10-CM | POA: Diagnosis not present

## 2023-07-09 DIAGNOSIS — R8290 Unspecified abnormal findings in urine: Secondary | ICD-10-CM | POA: Diagnosis not present

## 2023-07-17 DIAGNOSIS — I480 Paroxysmal atrial fibrillation: Secondary | ICD-10-CM | POA: Diagnosis not present

## 2023-07-17 DIAGNOSIS — I10 Essential (primary) hypertension: Secondary | ICD-10-CM | POA: Diagnosis not present

## 2023-07-17 DIAGNOSIS — Z7901 Long term (current) use of anticoagulants: Secondary | ICD-10-CM | POA: Diagnosis not present

## 2023-07-17 DIAGNOSIS — Z79899 Other long term (current) drug therapy: Secondary | ICD-10-CM | POA: Diagnosis not present

## 2023-07-17 DIAGNOSIS — Z95810 Presence of automatic (implantable) cardiac defibrillator: Secondary | ICD-10-CM | POA: Diagnosis not present

## 2023-07-17 DIAGNOSIS — E038 Other specified hypothyroidism: Secondary | ICD-10-CM | POA: Diagnosis not present

## 2023-07-17 DIAGNOSIS — N200 Calculus of kidney: Secondary | ICD-10-CM | POA: Diagnosis not present

## 2023-07-20 DIAGNOSIS — J45901 Unspecified asthma with (acute) exacerbation: Secondary | ICD-10-CM | POA: Diagnosis not present

## 2023-07-25 DIAGNOSIS — Z466 Encounter for fitting and adjustment of urinary device: Secondary | ICD-10-CM | POA: Diagnosis not present

## 2023-07-25 DIAGNOSIS — N200 Calculus of kidney: Secondary | ICD-10-CM | POA: Diagnosis not present

## 2023-07-30 DIAGNOSIS — F332 Major depressive disorder, recurrent severe without psychotic features: Secondary | ICD-10-CM | POA: Diagnosis not present

## 2023-07-30 DIAGNOSIS — I480 Paroxysmal atrial fibrillation: Secondary | ICD-10-CM | POA: Diagnosis not present

## 2023-07-30 DIAGNOSIS — E039 Hypothyroidism, unspecified: Secondary | ICD-10-CM | POA: Diagnosis not present

## 2023-07-30 DIAGNOSIS — R7301 Impaired fasting glucose: Secondary | ICD-10-CM | POA: Diagnosis not present

## 2023-07-30 DIAGNOSIS — M797 Fibromyalgia: Secondary | ICD-10-CM | POA: Diagnosis not present

## 2023-07-30 DIAGNOSIS — D519 Vitamin B12 deficiency anemia, unspecified: Secondary | ICD-10-CM | POA: Diagnosis not present

## 2023-07-30 DIAGNOSIS — E785 Hyperlipidemia, unspecified: Secondary | ICD-10-CM | POA: Diagnosis not present

## 2023-07-30 DIAGNOSIS — F319 Bipolar disorder, unspecified: Secondary | ICD-10-CM | POA: Diagnosis not present

## 2023-08-20 DIAGNOSIS — J45901 Unspecified asthma with (acute) exacerbation: Secondary | ICD-10-CM | POA: Diagnosis not present

## 2023-09-05 DIAGNOSIS — R7401 Elevation of levels of liver transaminase levels: Secondary | ICD-10-CM | POA: Diagnosis not present

## 2023-09-11 DIAGNOSIS — R8290 Unspecified abnormal findings in urine: Secondary | ICD-10-CM | POA: Diagnosis not present

## 2023-09-11 DIAGNOSIS — N200 Calculus of kidney: Secondary | ICD-10-CM | POA: Diagnosis not present

## 2023-09-20 DIAGNOSIS — J45901 Unspecified asthma with (acute) exacerbation: Secondary | ICD-10-CM | POA: Diagnosis not present

## 2023-11-01 DIAGNOSIS — D519 Vitamin B12 deficiency anemia, unspecified: Secondary | ICD-10-CM | POA: Diagnosis not present

## 2023-11-01 DIAGNOSIS — F319 Bipolar disorder, unspecified: Secondary | ICD-10-CM | POA: Diagnosis not present

## 2023-11-01 DIAGNOSIS — Z79899 Other long term (current) drug therapy: Secondary | ICD-10-CM | POA: Diagnosis not present

## 2023-11-01 DIAGNOSIS — F332 Major depressive disorder, recurrent severe without psychotic features: Secondary | ICD-10-CM | POA: Diagnosis not present

## 2023-11-01 DIAGNOSIS — R7401 Elevation of levels of liver transaminase levels: Secondary | ICD-10-CM | POA: Diagnosis not present

## 2023-11-01 DIAGNOSIS — R7301 Impaired fasting glucose: Secondary | ICD-10-CM | POA: Diagnosis not present

## 2023-11-01 DIAGNOSIS — I480 Paroxysmal atrial fibrillation: Secondary | ICD-10-CM | POA: Diagnosis not present

## 2023-11-01 DIAGNOSIS — E039 Hypothyroidism, unspecified: Secondary | ICD-10-CM | POA: Diagnosis not present

## 2023-11-01 DIAGNOSIS — G43109 Migraine with aura, not intractable, without status migrainosus: Secondary | ICD-10-CM | POA: Diagnosis not present

## 2023-11-01 DIAGNOSIS — M797 Fibromyalgia: Secondary | ICD-10-CM | POA: Diagnosis not present

## 2023-11-01 DIAGNOSIS — E785 Hyperlipidemia, unspecified: Secondary | ICD-10-CM | POA: Diagnosis not present

## 2023-11-06 DIAGNOSIS — L7211 Pilar cyst: Secondary | ICD-10-CM | POA: Diagnosis not present

## 2023-11-15 DIAGNOSIS — B9689 Other specified bacterial agents as the cause of diseases classified elsewhere: Secondary | ICD-10-CM | POA: Diagnosis not present

## 2023-11-15 DIAGNOSIS — J208 Acute bronchitis due to other specified organisms: Secondary | ICD-10-CM | POA: Diagnosis not present

## 2023-12-10 DIAGNOSIS — D519 Vitamin B12 deficiency anemia, unspecified: Secondary | ICD-10-CM | POA: Diagnosis not present

## 2023-12-10 DIAGNOSIS — J208 Acute bronchitis due to other specified organisms: Secondary | ICD-10-CM | POA: Diagnosis not present

## 2023-12-20 DIAGNOSIS — J45901 Unspecified asthma with (acute) exacerbation: Secondary | ICD-10-CM | POA: Diagnosis not present
